# Patient Record
Sex: Male | Born: 1950 | Race: White | Hispanic: No | Smoking: Current every day smoker
Health system: Southern US, Community
[De-identification: ages and names within clinical notes are randomized; demographics above are authoritative.]

## PROBLEM LIST (undated history)

## (undated) DIAGNOSIS — N401 Enlarged prostate with lower urinary tract symptoms: Secondary | ICD-10-CM

## (undated) DIAGNOSIS — N400 Enlarged prostate without lower urinary tract symptoms: Secondary | ICD-10-CM

## (undated) DIAGNOSIS — N183 Chronic kidney disease, stage 3 unspecified: Secondary | ICD-10-CM

## (undated) DIAGNOSIS — N529 Male erectile dysfunction, unspecified: Secondary | ICD-10-CM

## (undated) DIAGNOSIS — N200 Calculus of kidney: Secondary | ICD-10-CM

## (undated) DIAGNOSIS — E785 Hyperlipidemia, unspecified: Secondary | ICD-10-CM

## (undated) HISTORY — DX: Hyperlipidemia, unspecified: E78.5

## (undated) HISTORY — PX: PROSTATE BIOPSY: SHX241

## (undated) HISTORY — DX: Male erectile dysfunction, unspecified: N52.9

## (undated) HISTORY — DX: Benign prostatic hyperplasia without lower urinary tract symptoms: N40.0

## (undated) HISTORY — DX: Calculus of kidney: N20.0

## (undated) HISTORY — DX: Benign prostatic hyperplasia with lower urinary tract symptoms: N40.1

## (undated) HISTORY — DX: Chronic kidney disease, stage 3 unspecified: N18.30

---
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
## 2015-05-27 DIAGNOSIS — N400 Enlarged prostate without lower urinary tract symptoms: Secondary | ICD-10-CM | POA: Diagnosis not present

## 2015-05-27 DIAGNOSIS — E785 Hyperlipidemia, unspecified: Secondary | ICD-10-CM | POA: Diagnosis not present

## 2015-05-27 DIAGNOSIS — N529 Male erectile dysfunction, unspecified: Secondary | ICD-10-CM | POA: Diagnosis not present

## 2015-05-27 DIAGNOSIS — Z23 Encounter for immunization: Secondary | ICD-10-CM | POA: Diagnosis not present

## 2015-11-17 DIAGNOSIS — Z23 Encounter for immunization: Secondary | ICD-10-CM | POA: Diagnosis not present

## 2015-11-17 DIAGNOSIS — Z1212 Encounter for screening for malignant neoplasm of rectum: Secondary | ICD-10-CM | POA: Diagnosis not present

## 2015-11-17 DIAGNOSIS — Z0000 Encounter for general adult medical examination without abnormal findings: Secondary | ICD-10-CM | POA: Diagnosis not present

## 2015-11-17 DIAGNOSIS — Z9181 History of falling: Secondary | ICD-10-CM | POA: Diagnosis not present

## 2015-11-17 DIAGNOSIS — E785 Hyperlipidemia, unspecified: Secondary | ICD-10-CM | POA: Diagnosis not present

## 2015-11-17 DIAGNOSIS — Z79899 Other long term (current) drug therapy: Secondary | ICD-10-CM | POA: Diagnosis not present

## 2015-11-17 DIAGNOSIS — E663 Overweight: Secondary | ICD-10-CM | POA: Diagnosis not present

## 2015-11-17 DIAGNOSIS — Z125 Encounter for screening for malignant neoplasm of prostate: Secondary | ICD-10-CM | POA: Diagnosis not present

## 2015-11-17 DIAGNOSIS — Z1211 Encounter for screening for malignant neoplasm of colon: Secondary | ICD-10-CM | POA: Diagnosis not present

## 2015-11-17 DIAGNOSIS — Z1389 Encounter for screening for other disorder: Secondary | ICD-10-CM | POA: Diagnosis not present

## 2015-11-25 DIAGNOSIS — Z720 Tobacco use: Secondary | ICD-10-CM | POA: Diagnosis not present

## 2015-11-25 DIAGNOSIS — R69 Illness, unspecified: Secondary | ICD-10-CM | POA: Diagnosis not present

## 2018-09-26 ENCOUNTER — Encounter: Payer: Self-pay | Admitting: Cardiology

## 2018-09-28 ENCOUNTER — Encounter: Payer: Self-pay | Admitting: Cardiology

## 2018-09-28 ENCOUNTER — Ambulatory Visit (INDEPENDENT_AMBULATORY_CARE_PROVIDER_SITE_OTHER): Payer: Medicare HMO | Admitting: Cardiology

## 2018-09-28 ENCOUNTER — Other Ambulatory Visit: Payer: Self-pay

## 2018-09-28 VITALS — BP 114/76 | HR 81 | Ht 74.0 in | Wt 210.8 lb

## 2018-09-28 DIAGNOSIS — I483 Typical atrial flutter: Secondary | ICD-10-CM

## 2018-09-28 DIAGNOSIS — I4892 Unspecified atrial flutter: Secondary | ICD-10-CM | POA: Diagnosis not present

## 2018-09-28 DIAGNOSIS — Z13220 Encounter for screening for lipoid disorders: Secondary | ICD-10-CM | POA: Diagnosis not present

## 2018-09-28 NOTE — Patient Instructions (Signed)
Medication Instructions:  Your physician has recommended you make the following change in your medication:   START: Aspirin 81 mg Take 1 tab daily   If you need a refill on your cardiac medications before your next appointment, please call your pharmacy.   Lab work: Your physician recommends that you return for lab work in: TODAY BMP,Magnesium,TSH  1 month : Fasting lipid  If you have labs (blood work) drawn today and your tests are completely normal, you will receive your results only by: Marland Kitchen MyChart Message (if you have MyChart) OR . A paper copy in the mail If you have any lab test that is abnormal or we need to change your treatment, we will call you to review the results.  Testing/Procedures: Your physician has requested that you have an echocardiogram. Echocardiography is a painless test that uses sound waves to create images of your heart. It provides your doctor with information about the size and shape of your heart and how well your heart's chambers and valves are working. This procedure takes approximately one hour. There are no restrictions for this procedure.  Your physician has recommended that you wear a ZIO monitor. ZIO monitors are medical devices that record the heart's electrical activity. Doctors most often use these monitors to diagnose arrhythmias. Arrhythmias are problems with the speed or rhythm of the heartbeat. The monitor is a small, portable device. You can wear one while you do your normal daily activities. This is usually used to diagnose what is causing palpitations/syncope (passing out).  Wear 3 days  Follow-Up: At [facility], you and your health needs are our priority.  As part of our continuing mission to provide you with exceptional heart care, we have created designated Provider Care Teams.  These Care Teams include your primary Cardiologist (physician) and Advanced Practice Providers (APPs -  Physician Assistants and Nurse Practitioners) who all work  together to provide you with the care you need, when you need it. You will need a follow up appointment in 2 months with Dr Harriet Masson. Any Other Special Instructions Will Be Listed Below (If Applicable).

## 2018-09-28 NOTE — Progress Notes (Addendum)
Cardiology Office Note:    Date:  09/28/2018   ID:  Dylan Gordon, DOB 05-Feb-1950, MRN [id number]  PCP:  Nicoletta Dress, MD  Cardiologist:  No primary care provider on file.  Electrophysiologist:  None   Referring MD: No ref. provider found   Referred for abnormal heart rhythm.  ASSESSMENT:    1. Typical Atrial Flutter   2. 3 4 5  Lipid screening  Hyperlipidemi Tobacco Use Chronic kidney disease stage III    PLAN:    1. The patient does have evidence of atrial flutter with variable AV block in the office today.  It appears to be atypical flutter.  For now we will put a 3-day monitor on patient to see if he is continuously in atrial flutter.  His chads Vascor today is 1, therefore at this time anticoagulation will not be started.  I will start him on aspirin 81 mg.  Treatment strategy was discussed with patient as if his ambulatory monitor show he is continuously in atrial flutter even though controlled it would be of benefit to give him of 3 weeks of anticoagulation, performed DC cardioversion and then continue anticoagulation for 4 weeks.  Probably that would help restore sinus rhythm and he would need to pursue an ablation.  He is also aware if we do need to pursue ablation he will have to see our EP colleagues. 2. Transthoracic echocardiogram has been ordered to assess LV dysfunction and structural abnormalities. 3. He will get lipid panel, BMP, TSH prior to his next visit.  4. Continue patient his statin. 5. Smoking Cessation advised. 6. The patient is in agreement with the above plan. The patient left the office in stable condition.  The patient will follow up in 8 weeks.   Medication Adjustments/Labs and Tests Ordered: Current medicines are reviewed at length with the patient today.  Concerns regarding medicines are outlined above.  Orders Placed This Encounter  Procedures  . Basic Metabolic Panel (BMET)  . Magnesium  . TSH  . Lipid Profile  . LONG TERM MONITOR  (3-14 DAYS)  . EKG 12-Lead  . ECHOCARDIOGRAM COMPLETE   No orders of the defined types were placed in this encounter.  History of Present Illness:    Dylan Gordon is a 68 y.o. male with a hx of hyperlipidemia, chronic kidney disease history, BPH who presents to be evaluated for abnormal heart rhythm.  Per patient he was getting his endoscopy when he was told to his heart rhythm was not right.  He states that he is never been told this before.  In addition he denies any palpitations, chest pain shortness of breath, lightheadedness or dizziness.  His records from the endoscopy center it was noted that the patient was in an irregular ventricular rhythm during his procedure.  Today he offers no complaints. Past Medical History:  Diagnosis Date  . Benign prostatic hyperplasia   . Calculus of kidney   . Chronic kidney disease, stage III (moderate) (HCC)   . Enlarged prostate with lower urinary tract symptoms (LUTS)   . Erectile dysfunction   . Hyperlipidemia     Past Surgical History:  Procedure Laterality Date  . PROSTATE BIOPSY    . TONSILLECTOMY  1954    Current Medications: Current Meds  Medication Sig  . albuterol (VENTOLIN HFA) 108 (90 Base) MCG/ACT inhaler Inhale 1-2 puffs into the lungs every 4 (four) hours as needed for wheezing or shortness of breath.  Marland Kitchen atorvastatin (LIPITOR) 20 MG tablet Take 20 mg  by mouth daily.  . Multiple Vitamin (MULTIVITAMIN WITH MINERALS) TABS tablet Take 1 tablet by mouth 2 (two) times a week.   . Omega-3 Fatty Acids (FISH OIL) 1000 MG CAPS Take 1,000 mg by mouth 2 (two) times a week.   . tamsulosin (FLOMAX) 0.4 MG CAPS capsule Take 0.4 mg by mouth daily after supper.     Allergies:   Patient has no known allergies.   Social History   Socioeconomic History  . Marital status: Divorced    Spouse name: Not on file  . Number of children: Not on file  . Years of education: Not on file  . Highest education level: Not on file  Occupational  History  . Not on file  Social Needs  . Financial resource strain: Not on file  . Food insecurity    Worry: Not on file    Inability: Not on file  . Transportation needs    Medical: Not on file    Non-medical: Not on file  Tobacco Use  . Smoking status: Current Every Day Smoker    Packs/day: 0.50    Years: 53.00    Pack years: 26.50    Types: Cigarettes  . Smokeless tobacco: Never Used  . Tobacco comment: 15 cigarettes per day  Substance and Sexual Activity  . Alcohol use: Not Currently  . Drug use: Never  . Sexual activity: Not on file  Lifestyle  . Physical activity    Days per week: Not on file    Minutes per session: Not on file  . Stress: Not on file  Relationships  . Social Musicianconnections    Talks on phone: Not on file    Gets together: Not on file    Attends religious service: Not on file    Active member of club or organization: Not on file    Attends meetings of clubs or organizations: Not on file    Relationship status: Not on file  Other Topics Concern  . Not on file  Social History Narrative  . Not on file     Family History: The patient's family history includes Arrhythmia in his mother; Asthma in his mother; Colon cancer in his maternal aunt; Emphysema in his father.  ROS:   Review of Systems  Constitution: Negative for decreased appetite, fever and weight gain.  HENT: Negative for congestion, ear discharge, hoarse voice and sore throat.   Eyes: Negative for discharge, redness, vision loss in right eye and visual halos.  Cardiovascular: Negative for chest pain, dyspnea on exertion, leg swelling, orthopnea and palpitations.  Respiratory: Negative for cough, hemoptysis, shortness of breath and snoring.   Endocrine: Negative for heat intolerance and polyphagia.  Hematologic/Lymphatic: Negative for bleeding problem. Does not bruise/bleed easily.  Skin: Negative for flushing, nail changes, rash and suspicious lesions.  Musculoskeletal: Negative for  arthritis, joint pain, muscle cramps, myalgias, neck pain and stiffness.  Gastrointestinal: Negative for abdominal pain, bowel incontinence, diarrhea and excessive appetite.  Genitourinary: Negative for decreased libido, genital sores and incomplete emptying.  Neurological: Negative for brief paralysis, focal weakness, headaches and loss of balance.  Psychiatric/Behavioral: Negative for altered mental status, depression and suicidal ideas.  Allergic/Immunologic: Negative for HIV exposure and persistent infections.    EKGs/Labs/Other Studies Reviewed:    The following studies were reviewed today:  EKG:   The ekg ordered today demonstrates EKG today shows evidence of atypical coarse atrial flutter, heart rate 81 bpm with variable AV block.  No previous EKG  Recent  Labs: No results found for requested labs within last 8760 hours.  Recent Lipid Panel No results found for: CHOL, TRIG, HDL, CHOLHDL, VLDL, LDLCALC, LDLDIRECT  Physical Exam:    VS:  BP 114/76 (BP Location: Right Arm, Patient Position: Sitting, Cuff Size: Normal)   Pulse 81   Ht 6\' 2"  (1.88 m)   Wt 210 lb 12.8 oz (95.6 kg)   SpO2 93%   BMI 27.07 kg/m     Wt Readings from Last 3 Encounters:  09/28/18 210 lb 12.8 oz (95.6 kg)     GEN: Well nourished, well developed in no acute distress HEENT: Mucous membranes moist, good dentition NECK: No JVD; No carotid bruits LYMPHATICS: No lymphadenopathy CARDIAC: S1S2 noted, RRR, no murmurs, rubs, gallops RESPIRATORY:  Clear to auscultation without rales, wheezing or rhonchi  ABDOMEN: Soft, non-tender, non-distended, bowel sounds noted, no guarding EXTREMITIES: Bilateral trace ankle, no cyanosis, no clubbing MUSCULOSKELETAL: No deformity  SKIN: Warm and dry NEUROLOGIC:  Alert and oriented x 3, nonfocal PSYCHIATRIC:  Normal affect, good insight   Patient Instructions  Medication Instructions:  Your physician has recommended you make the following change in your medication:    START: Aspirin 81 mg Take 1 tab daily   If you need a refill on your cardiac medications before your next appointment, please call your pharmacy.   Lab work: Your physician recommends that you return for lab work in: TODAY BMP,Magnesium,TSH  1 month : Fasting lipid  If you have labs (blood work) drawn today and your tests are completely normal, you will receive your results only by: Marland Kitchen MyChart Message (if you have MyChart) OR . A paper copy in the mail If you have any lab test that is abnormal or we need to change your treatment, we will call you to review the results.  Testing/Procedures: Your physician has requested that you have an echocardiogram. Echocardiography is a painless test that uses sound waves to create images of your heart. It provides your doctor with information about the size and shape of your heart and how well your heart's chambers and valves are working. This procedure takes approximately one hour. There are no restrictions for this procedure.  Your physician has recommended that you wear a ZIO monitor. ZIO monitors are medical devices that record the heart's electrical activity. Doctors most often use these monitors to diagnose arrhythmias. Arrhythmias are problems with the speed or rhythm of the heartbeat. The monitor is a small, portable device. You can wear one while you do your normal daily activities. This is usually used to diagnose what is causing palpitations/syncope (passing out).  Wear 3 days  Follow-Up: At [facility], you and your health needs are our priority.  As part of our continuing mission to provide you with exceptional heart care, we have created designated Provider Care Teams.  These Care Teams include your primary Cardiologist (physician) and Advanced Practice Providers (APPs -  Physician Assistants and Nurse Practitioners) who all work together to provide you with the care you need, when you need it. You will need a follow up appointment in 2  months with Dr Servando Salina. Any Other Special Instructions Will Be Listed Below (If Applicable).       Osvaldo Shipper, DO  09/28/2018 10:02 PM    [facility]

## 2018-09-29 LAB — BASIC METABOLIC PANEL
BUN/Creatinine Ratio: 12 (ref 10–24)
BUN: 13 mg/dL (ref 8–27)
CO2: 24 mmol/L (ref 20–29)
Calcium: 9.7 mg/dL (ref 8.6–10.2)
Chloride: 103 mmol/L (ref 96–106)
Creatinine, Ser: 1.09 mg/dL (ref 0.76–1.27)
GFR calc Af Amer: 80 mL/min/{1.73_m2} (ref >59)
GFR calc non Af Amer: 69 mL/min/{1.73_m2} (ref >59)
Glucose: 74 mg/dL (ref 65–99)
Potassium: 5.1 mmol/L (ref 3.5–5.2)
Sodium: 139 mmol/L (ref 134–144)

## 2018-09-29 LAB — TSH: TSH: 2.2 u[IU]/mL (ref 0.450–4.500)

## 2018-09-29 LAB — MAGNESIUM: Magnesium: 2.2 mg/dL (ref 1.6–2.3)

## 2018-10-01 ENCOUNTER — Telehealth: Payer: Self-pay | Admitting: Cardiology

## 2018-10-01 NOTE — Telephone Encounter (Signed)
Patient called and still has not received his monitor, please advise.

## 2018-10-01 NOTE — Telephone Encounter (Signed)
Telephone call to patient's both phones. No answer and no voicemail. Will continue efforts.

## 2018-10-01 NOTE — Telephone Encounter (Signed)
Telephone call to patient. Informed him it would take 2-4 business days and to call back if not received by the end of the week. Pt verbalized understanding.

## 2018-10-05 ENCOUNTER — Ambulatory Visit (INDEPENDENT_AMBULATORY_CARE_PROVIDER_SITE_OTHER): Payer: Medicare HMO

## 2018-10-05 DIAGNOSIS — I483 Typical atrial flutter: Secondary | ICD-10-CM

## 2018-10-29 DIAGNOSIS — I4892 Unspecified atrial flutter: Secondary | ICD-10-CM | POA: Insufficient documentation

## 2018-10-31 ENCOUNTER — Other Ambulatory Visit: Payer: Medicare HMO

## 2018-11-01 ENCOUNTER — Encounter: Payer: Self-pay | Admitting: Cardiology

## 2018-11-01 ENCOUNTER — Other Ambulatory Visit: Payer: Self-pay

## 2018-11-01 ENCOUNTER — Ambulatory Visit (INDEPENDENT_AMBULATORY_CARE_PROVIDER_SITE_OTHER): Payer: Medicare HMO

## 2018-11-01 ENCOUNTER — Ambulatory Visit: Payer: Medicare HMO | Admitting: Cardiology

## 2018-11-01 ENCOUNTER — Ambulatory Visit (INDEPENDENT_AMBULATORY_CARE_PROVIDER_SITE_OTHER): Payer: Medicare HMO | Admitting: Cardiology

## 2018-11-01 VITALS — BP 120/78 | HR 75 | Ht 74.0 in | Wt 215.0 lb

## 2018-11-01 DIAGNOSIS — Z01812 Encounter for preprocedural laboratory examination: Secondary | ICD-10-CM

## 2018-11-01 DIAGNOSIS — Z720 Tobacco use: Secondary | ICD-10-CM | POA: Diagnosis not present

## 2018-11-01 DIAGNOSIS — I4892 Unspecified atrial flutter: Secondary | ICD-10-CM | POA: Diagnosis not present

## 2018-11-01 DIAGNOSIS — E782 Mixed hyperlipidemia: Secondary | ICD-10-CM | POA: Diagnosis not present

## 2018-11-01 DIAGNOSIS — I483 Typical atrial flutter: Secondary | ICD-10-CM | POA: Diagnosis not present

## 2018-11-01 MED ORDER — APIXABAN 5 MG PO TABS: 5.0000 mg | ORAL_TABLET | Freq: Two times a day (BID) | ORAL | 3 refills | Status: DC

## 2018-11-01 MED ORDER — METOPROLOL SUCCINATE ER 25 MG PO TB24: 12.5000 mg | ORAL_TABLET | Freq: Every day | ORAL | 1 refills | Status: DC

## 2018-11-01 NOTE — Progress Notes (Signed)
Cardiology Office Note:    Date:  11/01/2018   ID:  Dylan Gordon, DOB 09/09/1950, MRN [id number]  PCP:  Dylan Fusi, MD  Cardiologist:  No primary care provider on file.  Electrophysiologist:  None   Referring MD: Dylan Fusi, MD   Chief Complaint  Patient presents with   Follow-up    monitor and echo    History of Present Illness:    Dylan Gordon is a 68 y.o. male with a hx of hyperlipidemia, chronic kidney disease, history of BPH.  I initially saw patient on September 28, 2018 at which time he presented to be evaluated for irregular heart rhythm.  In the office the patient was noted to be atrial flutter 4:1.  At that time we will start aspirin 81 mg daily.  The patient was asymptomatic and states that he did not feel any palpitations dizziness or lightheadedness.  ZIO monitoring patch were placed on patient to understand how much atrial flutter burden he had as well as his heart rate excursions.  Today he is here for follow-up visit after wearing his monitor.  As well as to discuss his echo that was done this morning.  He offers no complaints.  He denies chest pain, shortness of breath, lightheadedness, dizziness, nausea vomiting.  He again denies any symptoms during the duration of his monitoring.  Past Medical History:  Diagnosis Date   Benign prostatic hyperplasia    Calculus of kidney    Chronic kidney disease, stage III (moderate)    Enlarged prostate with lower urinary tract symptoms (LUTS)    Erectile dysfunction    Hyperlipidemia     Past Surgical History:  Procedure Laterality Date   PROSTATE BIOPSY     TONSILLECTOMY  1954    Current Medications: Current Meds  Medication Sig   albuterol (VENTOLIN HFA) 108 (90 Base) MCG/ACT inhaler Inhale 1-2 puffs into the lungs every 4 (four) hours as needed for wheezing or shortness of breath.   atorvastatin (LIPITOR) 20 MG tablet Take 20 mg by mouth daily.   finasteride (PROSCAR) 5 MG tablet  Take 5 mg by mouth daily.   Multiple Vitamin (MULTIVITAMIN WITH MINERALS) TABS tablet Take 1 tablet by mouth 2 (two) times a week.    Omega-3 Fatty Acids (FISH OIL) 1000 MG CAPS Take 1,000 mg by mouth 2 (two) times a week.    tamsulosin (FLOMAX) 0.4 MG CAPS capsule Take 0.4 mg by mouth daily after supper.   [DISCONTINUED] aspirin EC 81 MG tablet Take 81 mg by mouth daily.     Allergies:   Patient has no known allergies.   Social History   Socioeconomic History   Marital status: Divorced    Spouse name: Not on file   Number of children: Not on file   Years of education: Not on file   Highest education level: Not on file  Occupational History   Not on file  Social Needs   Financial resource strain: Not on file   Food insecurity    Worry: Not on file    Inability: Not on file   Transportation needs    Medical: Not on file    Non-medical: Not on file  Tobacco Use   Smoking status: Current Every Day Smoker    Packs/day: 0.50    Years: 53.00    Pack years: 26.50    Types: Cigarettes   Smokeless tobacco: Never Used   Tobacco comment: 15 cigarettes per day  Substance and Sexual Activity  Alcohol use: Not Currently   Drug use: Never   Sexual activity: Not on file  Lifestyle   Physical activity    Days per week: Not on file    Minutes per session: Not on file   Stress: Not on file  Relationships   Social connections    Talks on phone: Not on file    Gets together: Not on file    Attends religious service: Not on file    Active member of club or organization: Not on file    Attends meetings of clubs or organizations: Not on file    Relationship status: Not on file  Other Topics Concern   Not on file  Social History Narrative   Not on file     Family History: The patient's family history includes Arrhythmia in his mother; Asthma in his mother; Colon cancer in his maternal aunt; Emphysema in his father.  ROS:   Review of Systems  Constitution:  Negative for decreased appetite, fever and weight gain.  HENT: Negative for congestion, ear discharge, hoarse voice and sore throat.   Eyes: Negative for discharge, redness, vision loss in right eye and visual halos.  Cardiovascular: Negative for chest pain, dyspnea on exertion, leg swelling, orthopnea and palpitations.  Respiratory: Negative for cough, hemoptysis, shortness of breath and snoring.   Endocrine: Negative for heat intolerance and polyphagia.  Hematologic/Lymphatic: Negative for bleeding problem. Does not bruise/bleed easily.  Skin: Negative for flushing, nail changes, rash and suspicious lesions.  Musculoskeletal: Negative for arthritis, joint pain, muscle cramps, myalgias, neck pain and stiffness.  Gastrointestinal: Negative for abdominal pain, bowel incontinence, diarrhea and excessive appetite.  Genitourinary: Negative for decreased libido, genital sores and incomplete emptying.  Neurological: Negative for brief paralysis, focal weakness, headaches and loss of balance.  Psychiatric/Behavioral: Negative for altered mental status, depression and suicidal ideas.  Allergic/Immunologic: Negative for HIV exposure and persistent infections.    EKGs/Labs/Other Studies Reviewed:    The following studies were reviewed today:   EKG:  The ekg ordered today demonstrates typical atrial flutter heart rate 71 bpm.  Ambulatory monitoring: From September 25 to October 08, 2018. The patient wore the monitor for 3 days.  Indication: Atrial flutter. Atrial Flutter occurred continuously (100% burden), ranging from 45-144 bpm (avg of 76 bpm).  Premature ventricular complexes were rare (<1.0%).  No pause, No ventricular tachycardia.  No patient triggered event or diary noted.   Conclusion: Remarkable study for 100% burden of atrial flutter.  Recent Labs: 09/28/2018: BUN 13; Creatinine, Ser 1.09; Magnesium 2.2; Potassium 5.1; Sodium 139; TSH 2.200  Recent Lipid Panel No results found  for: CHOL, TRIG, HDL, CHOLHDL, VLDL, LDLCALC, LDLDIRECT  Physical Exam:    VS:  BP 120/78 (BP Location: Right Arm, Patient Position: Sitting, Cuff Size: Normal)    Pulse 75    Ht  (1.88 m)    Wt 215 lb (97.5 kg)    SpO2 96%    BMI 27.60 kg/m     Wt Readings from Last 3 Encounters:  11/01/18 215 lb (97.5 kg)  09/28/18 210 lb 12.8 oz (95.6 kg)     GEN: Well nourished, well developed in no acute distress HEENT: Normal NECK: No JVD; No carotid bruits LYMPHATICS: No lymphadenopathy CARDIAC: S1S2 noted,RRR, no murmurs, rubs, gallops RESPIRATORY:  Clear to auscultation without rales, wheezing or rhonchi  ABDOMEN: Soft, non-tender, non-distended, +bowel sounds, no guarding. EXTREMITIES: No edema, No cyanosis, no clubbing MUSCULOSKELETAL:  No edema; No deformity  SKIN:  Warm and dry NEUROLOGIC:  Alert and oriented x 3, non-focal PSYCHIATRIC:  Normal affect, good insight  ASSESSMENT:    1. Atrial flutter, unspecified type (HCC)   2. Pre-procedure lab exam   3. Mixed hyperlipidemia   4. Tobacco use    PLAN:     1.  The patient is here for follow-up visit today I was able to discuss results of his ambulatory monitor with him.  He was in atrial flutter 100% of the time.  He still does not feel this.  Advised patient to stop the aspirin 81 mg daily.  Therefore at this time will be appropriate to go ahead and anticoagulate the patient for 3 weeks and plan cardioversion thereafter.  I discussed this with the patient he is in agreement with proceeding with the anticoagulation.  I have educated patient on the side effects of this medication. I also urge he abstain from any taking behaviors.  He understands that he is now at a high risk of bleeding due to being on anticoagulation.  He was also advised that if he ever falls and especially hit his head to be seen in the emergency department.  2.  In addition given prescriptions on his monitor I am going to be starting patient on metoprolol  succinate 12.5 mg daily.  He was educated on this medication and also advised to call the office if he experience any dizziness or lightheadedness.  3.  The patient had his echocardiogram performed today preliminary reviewed by me show LVEF of 60 to 65%.  Trivial mitral regurgitation.  Complete results results will be dictated later.  4.  The patient was counseled on tobacco cessation today for 5 minutes.  Counseling included reviewing the risks of smoking tobacco products, how it impacts the patient's current medical diagnoses and different strategies for quitting.  Pharmacotherapy to aid in tobacco cessation was not prescribed today. The patient coordinate with  primary care provider.  The patient was also advised to call   1-800-QUIT-NOW (3052760908) for additional help with quitting smoking.  The patient is in agreement with the above plan. The patient left the office in stable condition.  The patient will follow up in 4 weeks   Medication Adjustments/Labs and Tests Ordered: Current medicines are reviewed at length with the patient today.  Concerns regarding medicines are outlined above.  Orders Placed This Encounter  Procedures   Basic Metabolic Panel (BMET)   CBC   EKG 12-Lead   Meds ordered this encounter  Medications   metoprolol succinate (TOPROL XL) 25 MG 24 hr tablet    Sig: Take 0.5 tablets (12.5 mg total) by mouth daily.    Dispense:  45 tablet    Refill:  1   apixaban (ELIQUIS) 5 MG TABS tablet    Sig: Take 1 tablet (5 mg total) by mouth 2 (two) times daily.    Dispense:  60 tablet    Refill:  3    Patient Instructions  Medication Instructions:  Your physician has recommended you make the following change in your medication:   STOP : Aspirin  START: TOPROL XL(metoprolol succinate ) 25 mg Take 1/ 2 tab (12.5 mg ) daily START: ELIQUIS 5 mg Take 1 tab twice daily  *If you need a refill on your cardiac medications before your next appointment, please call your  pharmacy*  Lab Work: Your physician recommends that you return for lab work in: TODAY BMP,CBC  If you have labs (blood work) drawn today and your  tests are completely normal, you will receive your results only by:  MyChart Message (if you have MyChart) OR  A paper copy in the mail If you have any lab test that is abnormal or we need to change your treatment, we will call you to review the results.  Testing/Procedures: Your physician has recommended that you have a Cardioversion (DCCV). Electrical Cardioversion uses a jolt of electricity to your heart either through paddles or wired patches attached to your chest. This is a controlled, usually prescheduled, procedure. Defibrillation is done under light anesthesia in the hospital, and you usually go home the day of the procedure. This is done to get your heart back into a normal rhythm. You are not awake for the procedure. Please see the instruction sheet given to you today.  We will schedule this at [facility] in 4 weeks and call you with the date and time and instructions.  Follow-Up: At [facility], you and your health needs are our priority.  As part of our continuing mission to provide you with exceptional heart care, we have created designated Provider Care Teams.  These Care Teams include your primary Cardiologist (physician) and Advanced Practice Providers (APPs -  Physician Assistants and Nurse Practitioners) who all work together to provide you with the care you need, when you need it.  Your next appointment:   4 weeks  The format for your next appointment:   In Person  Provider:   Berniece Salines, DO  Other Instructions  Apixaban oral tablets What is this medicine? APIXABAN (a PIX a ban) is an anticoagulant (blood thinner). It is used to lower the chance of stroke in people with a medical condition called atrial fibrillation. It is also used to treat or prevent blood clots in the lungs or in the veins. This medicine  may be used for other purposes; ask your health care provider or pharmacist if you have questions. COMMON BRAND NAME(S): Eliquis What should I tell my health care provider before I take this medicine? They need to know if you have any of these conditions:  antiphospholipid antibody syndrome  bleeding disorders  bleeding in the brain  blood in your stools (black or tarry stools) or if you have blood in your vomit  history of blood clots  history of stomach bleeding  kidney disease  liver disease  mechanical heart valve  an unusual or allergic reaction to apixaban, other medicines, foods, dyes, or preservatives  pregnant or trying to get pregnant  breast-feeding How should I use this medicine? Take this medicine by mouth with a glass of water. Follow the directions on the prescription label. You can take it with or without food. If it upsets your stomach, take it with food. Take your medicine at regular intervals. Do not take it more often than directed. Do not stop taking except on your doctor's advice. Stopping this medicine may increase your risk of a blood clot. Be sure to refill your prescription before you run out of medicine. Talk to your pediatrician regarding the use of this medicine in children. Special care may be needed. Overdosage: If you think you have taken too much of this medicine contact a poison control center or emergency room at once. NOTE: This medicine is only for you. Do not share this medicine with others. What if I miss a dose? If you miss a dose, take it as soon as you can. If it is almost time for your next dose, take only that dose.  Do not take double or extra doses. What may interact with this medicine? This medicine may interact with the following:  aspirin and aspirin-like medicines  certain medicines for fungal infections like ketoconazole and itraconazole  certain medicines for seizures like carbamazepine and phenytoin  certain medicines  that treat or prevent blood clots like warfarin, enoxaparin, and dalteparin  clarithromycin  NSAIDs, medicines for pain and inflammation, like ibuprofen or naproxen  rifampin  ritonavir  St. John's wort This list may not describe all possible interactions. Give your health care provider a list of all the medicines, herbs, non-prescription drugs, or dietary supplements you use. Also tell them if you smoke, drink alcohol, or use illegal drugs. Some items may interact with your medicine. What should I watch for while using this medicine? Visit your healthcare professional for regular checks on your progress. You may need blood work done while you are taking this medicine. Your condition will be monitored carefully while you are receiving this medicine. It is important not to miss any appointments. Avoid sports and activities that might cause injury while you are using this medicine. Severe falls or injuries can cause unseen bleeding. Be careful when using sharp tools or knives. Consider using an Neurosurgeonelectric razor. Take special care brushing or flossing your teeth. Report any injuries, bruising, or red spots on the skin to your healthcare professional. If you are going to need surgery or other procedure, tell your healthcare professional that you are taking this medicine. Wear a medical ID bracelet or chain. Carry a card that describes your disease and details of your medicine and dosage times. What side effects may I notice from receiving this medicine? Side effects that you should report to your doctor or health care professional as soon as possible:  allergic reactions like skin rash, itching or hives, swelling of the face, lips, or tongue  signs and symptoms of bleeding such as bloody or black, tarry stools; red or dark-brown urine; spitting up blood or brown material that looks like coffee grounds; red spots on the skin; unusual bruising or bleeding from the eye, gums, or nose  signs and  symptoms of a blood clot such as chest pain; shortness of breath; pain, swelling, or warmth in the leg  signs and symptoms of a stroke such as changes in vision; confusion; trouble speaking or understanding; severe headaches; sudden numbness or weakness of the face, arm or leg; trouble walking; dizziness; loss of coordination This list may not describe all possible side effects. Call your doctor for medical advice about side effects. You may report side effects to FDA at 1-800-FDA-1088. Where should I keep my medicine? Keep out of the reach of children. Store at room temperature between 20 and 25 degrees C (68 and 77 degrees F). Throw away any unused medicine after the expiration date. NOTE: This sheet is a summary. It may not cover all possible information. If you have questions about this medicine, talk to your doctor, pharmacist, or health care provider.  2020 Elsevier/Gold Standard (2017-09-06 17:39:34) Metoprolol extended-release tablets What is this medicine? METOPROLOL (me TOE proe lole) is a beta-blocker. Beta-blockers reduce the workload on the heart and help it to beat more regularly. This medicine is used to treat high blood pressure and to prevent chest pain. It is also used to after a heart attack and to prevent an additional heart attack from occurring. This medicine may be used for other purposes; ask your health care provider or pharmacist if you have questions. COMMON  BRAND NAME(S): toprol, Toprol XL What should I tell my health care provider before I take this medicine? They need to know if you have any of these conditions:  diabetes  heart or vessel disease like slow heart rate, worsening heart failure, heart block, sick sinus syndrome or Raynaud's disease  kidney disease  liver disease  lung or breathing disease, like asthma or emphysema  pheochromocytoma  thyroid disease  an unusual or allergic reaction to metoprolol, other beta-blockers, medicines, foods, dyes,  or preservatives  pregnant or trying to get pregnant  breast-feeding How should I use this medicine? Take this medicine by mouth with a glass of water. Follow the directions on the prescription label. Do not crush or chew. Take this medicine with or immediately after meals. Take your doses at regular intervals. Do not take more medicine than directed. Do not stop taking this medicine suddenly. This could lead to serious heart-related effects. Talk to your pediatrician regarding the use of this medicine in children. While this drug may be prescribed for children as young as 6 years for selected conditions, precautions do apply. Overdosage: If you think you have taken too much of this medicine contact a poison control center or emergency room at once. NOTE: This medicine is only for you. Do not share this medicine with others. What if I miss a dose? If you miss a dose, take it as soon as you can. If it is almost time for your next dose, take only that dose. Do not take double or extra doses. What may interact with this medicine? This medicine may interact with the following medications:  certain medicines for blood pressure, heart disease, irregular heart beat  certain medicines for depression, like monoamine oxidase (MAO) inhibitors, fluoxetine, or paroxetine  clonidine  dobutamine  epinephrine  isoproterenol  reserpine This list may not describe all possible interactions. Give your health care provider a list of all the medicines, herbs, non-prescription drugs, or dietary supplements you use. Also tell them if you smoke, drink alcohol, or use illegal drugs. Some items may interact with your medicine. What should I watch for while using this medicine? Visit your doctor or health care professional for regular check ups. Contact your doctor right away if your symptoms worsen. Check your blood pressure and pulse rate regularly. Ask your health care professional what your blood pressure and  pulse rate should be, and when you should contact them. You may get drowsy or dizzy. Do not drive, use machinery, or do anything that needs mental alertness until you know how this medicine affects you. Do not sit or stand up quickly, especially if you are an older patient. This reduces the risk of dizzy or fainting spells. Contact your doctor if these symptoms continue. Alcohol may interfere with the effect of this medicine. Avoid alcoholic drinks. This medicine may increase blood sugar. Ask your healthcare provider if changes in diet or medicines are needed if you have diabetes. What side effects may I notice from receiving this medicine? Side effects that you should report to your doctor or health care professional as soon as possible:  allergic reactions like skin rash, itching or hives  cold or numb hands or feet  depression  difficulty breathing  faint  fever with sore throat  irregular heartbeat, chest pain  rapid weight gain   signs and symptoms of high blood sugar such as being more thirsty or hungry or having to urinate more than normal. You may also feel very tired or  have blurry vision.  swollen legs or ankles Side effects that usually do not require medical attention (report to your doctor or health care professional if they continue or are bothersome):  anxiety or nervousness  change in sex drive or performance  dry skin  headache  nightmares or trouble sleeping  short term memory loss  stomach upset or diarrhea This list may not describe all possible side effects. Call your doctor for medical advice about side effects. You may report side effects to FDA at 1-800-FDA-1088. Where should I keep my medicine? Keep out of the reach of children. Store at room temperature between 15 and 30 degrees C (59 and 86 degrees F). Throw away any unused medicine after the expiration date. NOTE: This sheet is a summary. It may not cover all possible information. If you have  questions about this medicine, talk to your doctor, pharmacist, or health care provider.  2020 Elsevier/Gold Standard (2017-10-17 11:09:41)      Adopting a Healthy Lifestyle.  Know what a healthy weight is for you (roughly BMI <25) and aim to maintain this   Aim for 7+ servings of fruits and vegetables daily   65-80+ fluid ounces of water or unsweet tea for healthy kidneys   Limit to max 1 drink of alcohol per day; avoid smoking/tobacco   Limit animal fats in diet for cholesterol and heart health - choose grass fed whenever available   Avoid highly processed foods, and foods high in saturated/trans fats   Aim for low stress - take time to unwind and care for your mental health   Aim for 150 min of moderate intensity exercise weekly for heart health, and weights twice weekly for bone health   Aim for 7-9 hours of sleep daily   When it comes to diets, agreement about the perfect plan isnt easy to find, even among the experts. Experts at the [facility] [employer] developed an idea known as the Healthy Eating Plate. Just imagine a plate divided into logical, healthy portions.   The emphasis is on diet quality:   Load up on vegetables and fruits - one-half of your plate: Aim for color and variety, and remember that potatoes dont count.   Go for whole grains - one-quarter of your plate: Whole wheat, barley, wheat berries, quinoa, oats, brown rice, and foods made with them. If you want pasta, go with whole wheat pasta.   Protein power - one-quarter of your plate: Fish, chicken, beans, and nuts are all healthy, versatile protein sources. Limit red meat.   The diet, however, does go beyond the plate, offering a few other suggestions.   Use healthy plant oils, such as olive, canola, soy, corn, sunflower and peanut. Check the labels, and avoid partially hydrogenated oil, which have unhealthy trans fats.   If youre thirsty, drink water. Coffee and tea are good in  moderation, but skip sugary drinks and limit milk and dairy products to one or two daily servings.   The type of carbohydrate in the diet is more important than the amount. Some sources of carbohydrates, such as vegetables, fruits, whole grains, and beans-are healthier than others.   Finally, stay active  Signed, Thomasene Ripple, DO  11/01/2018 10:38 AM    [facility]

## 2018-11-01 NOTE — Progress Notes (Signed)
Complete echocardiogram has been performed.  Jimmy Herold Salguero RDCS, RVT 

## 2018-11-01 NOTE — Patient Instructions (Signed)
Medication Instructions:  Your physician has recommended you make the following change in your medication:   STOP : Aspirin  START: TOPROL XL(metoprolol succinate ) 25 mg Take 1/ 2 tab (12.5 mg ) daily START: ELIQUIS 5 mg Take 1 tab twice daily  *If you need a refill on your cardiac medications before your next appointment, please call your pharmacy*  Lab Work: Your physician recommends that you return for lab work in: TODAY BMP,CBC  If you have labs (blood work) drawn today and your tests are completely normal, you will receive your results only by: Marland Kitchen MyChart Message (if you have MyChart) OR . A paper copy in the mail If you have any lab test that is abnormal or we need to change your treatment, we will call you to review the results.  Testing/Procedures: Your physician has recommended that you have a Cardioversion (DCCV). Electrical Cardioversion uses a jolt of electricity to your heart either through paddles or wired patches attached to your chest. This is a controlled, usually prescheduled, procedure. Defibrillation is done under light anesthesia in the hospital, and you usually go home the day of the procedure. This is done to get your heart back into a normal rhythm. You are not awake for the procedure. Please see the instruction sheet given to you today.  We will schedule this at [facility] in 4 weeks and call you with the date and time and instructions.  Follow-Up: At [facility], you and your health needs are our priority.  As part of our continuing mission to provide you with exceptional heart care, we have created designated Provider Care Teams.  These Care Teams include your primary Cardiologist (physician) and Advanced Practice Providers (APPs -  Physician Assistants and Nurse Practitioners) who all work together to provide you with the care you need, when you need it.  Your next appointment:   4 weeks  The format for your next appointment:   In  Person  Provider:   Thomasene Ripple, DO  Other Instructions  Apixaban oral tablets What is this medicine? APIXABAN (a PIX a ban) is an anticoagulant (blood thinner). It is used to lower the chance of stroke in people with a medical condition called atrial fibrillation. It is also used to treat or prevent blood clots in the lungs or in the veins. This medicine may be used for other purposes; ask your health care provider or pharmacist if you have questions. COMMON BRAND NAME(S): Eliquis What should I tell my health care provider before I take this medicine? They need to know if you have any of these conditions:  antiphospholipid antibody syndrome  bleeding disorders  bleeding in the brain  blood in your stools (black or tarry stools) or if you have blood in your vomit  history of blood clots  history of stomach bleeding  kidney disease  liver disease  mechanical heart valve  an unusual or allergic reaction to apixaban, other medicines, foods, dyes, or preservatives  pregnant or trying to get pregnant  breast-feeding How should I use this medicine? Take this medicine by mouth with a glass of water. Follow the directions on the prescription label. You can take it with or without food. If it upsets your stomach, take it with food. Take your medicine at regular intervals. Do not take it more often than directed. Do not stop taking except on your doctor's advice. Stopping this medicine may increase your risk of a blood clot. Be sure to refill your prescription before  you run out of medicine. Talk to your pediatrician regarding the use of this medicine in children. Special care may be needed. Overdosage: If you think you have taken too much of this medicine contact a poison control center or emergency room at once. NOTE: This medicine is only for you. Do not share this medicine with others. What if I miss a dose? If you miss a dose, take it as soon as you can. If it is almost time  for your next dose, take only that dose. Do not take double or extra doses. What may interact with this medicine? This medicine may interact with the following:  aspirin and aspirin-like medicines  certain medicines for fungal infections like ketoconazole and itraconazole  certain medicines for seizures like carbamazepine and phenytoin  certain medicines that treat or prevent blood clots like warfarin, enoxaparin, and dalteparin  clarithromycin  NSAIDs, medicines for pain and inflammation, like ibuprofen or naproxen  rifampin  ritonavir  St. John's wort This list may not describe all possible interactions. Give your health care provider a list of all the medicines, herbs, non-prescription drugs, or dietary supplements you use. Also tell them if you smoke, drink alcohol, or use illegal drugs. Some items may interact with your medicine. What should I watch for while using this medicine? Visit your healthcare professional for regular checks on your progress. You may need blood work done while you are taking this medicine. Your condition will be monitored carefully while you are receiving this medicine. It is important not to miss any appointments. Avoid sports and activities that might cause injury while you are using this medicine. Severe falls or injuries can cause unseen bleeding. Be careful when using sharp tools or knives. Consider using an Neurosurgeon. Take special care brushing or flossing your teeth. Report any injuries, bruising, or red spots on the skin to your healthcare professional. If you are going to need surgery or other procedure, tell your healthcare professional that you are taking this medicine. Wear a medical ID bracelet or chain. Carry a card that describes your disease and details of your medicine and dosage times. What side effects may I notice from receiving this medicine? Side effects that you should report to your doctor or health care professional as soon as  possible:  allergic reactions like skin rash, itching or hives, swelling of the face, lips, or tongue  signs and symptoms of bleeding such as bloody or black, tarry stools; red or dark-brown urine; spitting up blood or brown material that looks like coffee grounds; red spots on the skin; unusual bruising or bleeding from the eye, gums, or nose  signs and symptoms of a blood clot such as chest pain; shortness of breath; pain, swelling, or warmth in the leg  signs and symptoms of a stroke such as changes in vision; confusion; trouble speaking or understanding; severe headaches; sudden numbness or weakness of the face, arm or leg; trouble walking; dizziness; loss of coordination This list may not describe all possible side effects. Call your doctor for medical advice about side effects. You may report side effects to FDA at 1-800-FDA-1088. Where should I keep my medicine? Keep out of the reach of children. Store at room temperature between 20 and 25 degrees C (68 and 77 degrees F). Throw away any unused medicine after the expiration date. NOTE: This sheet is a summary. It may not cover all possible information. If you have questions about this medicine, talk to your doctor, pharmacist, or health  care provider.  2020 Elsevier/Gold Standard (2017-09-06 17:39:34) Metoprolol extended-release tablets What is this medicine? METOPROLOL (me TOE proe lole) is a beta-blocker. Beta-blockers reduce the workload on the heart and help it to beat more regularly. This medicine is used to treat high blood pressure and to prevent chest pain. It is also used to after a heart attack and to prevent an additional heart attack from occurring. This medicine may be used for other purposes; ask your health care provider or pharmacist if you have questions. COMMON BRAND NAME(S): toprol, Toprol XL What should I tell my health care provider before I take this medicine? They need to know if you have any of these  conditions:  diabetes  heart or vessel disease like slow heart rate, worsening heart failure, heart block, sick sinus syndrome or Raynaud's disease  kidney disease  liver disease  lung or breathing disease, like asthma or emphysema  pheochromocytoma  thyroid disease  an unusual or allergic reaction to metoprolol, other beta-blockers, medicines, foods, dyes, or preservatives  pregnant or trying to get pregnant  breast-feeding How should I use this medicine? Take this medicine by mouth with a glass of water. Follow the directions on the prescription label. Do not crush or chew. Take this medicine with or immediately after meals. Take your doses at regular intervals. Do not take more medicine than directed. Do not stop taking this medicine suddenly. This could lead to serious heart-related effects. Talk to your pediatrician regarding the use of this medicine in children. While this drug may be prescribed for children as young as 6 years for selected conditions, precautions do apply. Overdosage: If you think you have taken too much of this medicine contact a poison control center or emergency room at once. NOTE: This medicine is only for you. Do not share this medicine with others. What if I miss a dose? If you miss a dose, take it as soon as you can. If it is almost time for your next dose, take only that dose. Do not take double or extra doses. What may interact with this medicine? This medicine may interact with the following medications:  certain medicines for blood pressure, heart disease, irregular heart beat  certain medicines for depression, like monoamine oxidase (MAO) inhibitors, fluoxetine, or paroxetine  clonidine  dobutamine  epinephrine  isoproterenol  reserpine This list may not describe all possible interactions. Give your health care provider a list of all the medicines, herbs, non-prescription drugs, or dietary supplements you use. Also tell them if you  smoke, drink alcohol, or use illegal drugs. Some items may interact with your medicine. What should I watch for while using this medicine? Visit your doctor or health care professional for regular check ups. Contact your doctor right away if your symptoms worsen. Check your blood pressure and pulse rate regularly. Ask your health care professional what your blood pressure and pulse rate should be, and when you should contact them. You may get drowsy or dizzy. Do not drive, use machinery, or do anything that needs mental alertness until you know how this medicine affects you. Do not sit or stand up quickly, especially if you are an older patient. This reduces the risk of dizzy or fainting spells. Contact your doctor if these symptoms continue. Alcohol may interfere with the effect of this medicine. Avoid alcoholic drinks. This medicine may increase blood sugar. Ask your healthcare provider if changes in diet or medicines are needed if you have diabetes. What side effects may I  notice from receiving this medicine? Side effects that you should report to your doctor or health care professional as soon as possible:  allergic reactions like skin rash, itching or hives  cold or numb hands or feet  depression  difficulty breathing  faint  fever with sore throat  irregular heartbeat, chest pain  rapid weight gain   signs and symptoms of high blood sugar such as being more thirsty or hungry or having to urinate more than normal. You may also feel very tired or have blurry vision.  swollen legs or ankles Side effects that usually do not require medical attention (report to your doctor or health care professional if they continue or are bothersome):  anxiety or nervousness  change in sex drive or performance  dry skin  headache  nightmares or trouble sleeping  short term memory loss  stomach upset or diarrhea This list may not describe all possible side effects. Call your doctor for  medical advice about side effects. You may report side effects to FDA at 1-800-FDA-1088. Where should I keep my medicine? Keep out of the reach of children. Store at room temperature between 15 and 30 degrees C (59 and 86 degrees F). Throw away any unused medicine after the expiration date. NOTE: This sheet is a summary. It may not cover all possible information. If you have questions about this medicine, talk to your doctor, pharmacist, or health care provider.  2020 Elsevier/Gold Standard (2017-10-17 11:09:41)

## 2018-11-02 LAB — CBC
Hematocrit: 44.9 % (ref 37.5–51.0)
Hemoglobin: 14.5 g/dL (ref 13.0–17.7)
MCH: 29.8 pg (ref 26.6–33.0)
MCHC: 32.3 g/dL (ref 31.5–35.7)
MCV: 92 fL (ref 79–97)
Platelets: 222 10*3/uL (ref 150–450)
RBC: 4.86 x10E6/uL (ref 4.14–5.80)
RDW: 12.8 % (ref 11.6–15.4)
WBC: 9.6 10*3/uL (ref 3.4–10.8)

## 2018-11-02 LAB — BASIC METABOLIC PANEL
BUN/Creatinine Ratio: 12 (ref 10–24)
BUN: 15 mg/dL (ref 8–27)
CO2: 24 mmol/L (ref 20–29)
Calcium: 9.6 mg/dL (ref 8.6–10.2)
Chloride: 104 mmol/L (ref 96–106)
Creatinine, Ser: 1.25 mg/dL (ref 0.76–1.27)
GFR calc Af Amer: 67 mL/min/{1.73_m2} (ref >59)
GFR calc non Af Amer: 58 mL/min/{1.73_m2} — ABNORMAL LOW (ref >59)
Glucose: 91 mg/dL (ref 65–99)
Potassium: 4.9 mmol/L (ref 3.5–5.2)
Sodium: 141 mmol/L (ref 134–144)

## 2018-11-19 DIAGNOSIS — I4892 Unspecified atrial flutter: Secondary | ICD-10-CM | POA: Diagnosis not present

## 2018-11-28 ENCOUNTER — Ambulatory Visit: Payer: Medicare HMO | Admitting: Cardiology

## 2018-11-29 ENCOUNTER — Encounter: Payer: Self-pay | Admitting: Cardiology

## 2018-11-29 ENCOUNTER — Ambulatory Visit (INDEPENDENT_AMBULATORY_CARE_PROVIDER_SITE_OTHER): Payer: Medicare HMO | Admitting: Cardiology

## 2018-11-29 ENCOUNTER — Other Ambulatory Visit: Payer: Self-pay

## 2018-11-29 VITALS — BP 120/60 | HR 60 | Ht 74.0 in | Wt 219.0 lb

## 2018-11-29 DIAGNOSIS — I4892 Unspecified atrial flutter: Secondary | ICD-10-CM

## 2018-11-29 MED ORDER — APIXABAN 5 MG PO TABS: 5.0000 mg | ORAL_TABLET | Freq: Two times a day (BID) | ORAL | 5 refills | Status: DC

## 2018-11-29 NOTE — Patient Instructions (Signed)
Medication Instructions:  Your physician recommends that you continue on your current medications as directed. Please refer to the Current Medication list given to you today.  *If you need a refill on your cardiac medications before your next appointment, please call your pharmacy*  Lab Work: None If you have labs (blood work) drawn today and your tests are completely normal, you will receive your results only by: . MyChart Message (if you have MyChart) OR . A paper copy in the mail If you have any lab test that is abnormal or we need to change your treatment, we will call you to review the results.  Testing/Procedures: NOne  Follow-Up: At [facility], you and your health needs are our priority.  As part of our continuing mission to provide you with exceptional heart care, we have created designated Provider Care Teams.  These Care Teams include your primary Cardiologist (physician) and Advanced Practice Providers (APPs -  Physician Assistants and Nurse Practitioners) who all work together to provide you with the care you need, when you need it.  Your next appointment:   3 month(s)  The format for your next appointment:   In Person  Provider:   Kardie Tobb, DO  Other Instructions   

## 2018-11-29 NOTE — Progress Notes (Signed)
Cardiology Office Note:    Date:  11/29/2018   ID:  Dylan Gordon, DOB Nov 17, 1950, MRN [id number]  PCP:  Paulina FusiSchultz, Douglas E, MD  Cardiologist:  Thomasene RippleKardie Everet Flagg, DO  Electrophysiologist:  None   Referring MD: Paulina FusiSchultz, Douglas E, MD   Chief Complaint  Patient presents with  . Follow-up    History of Present Illness:    Dylan Gordon is a 68 y.o. male with a hx of hyperlipidemia, chronic kidney disease, history of BPH.  I initially saw patient on September 28, 2018 at which time he presented to be evaluated for irregular heart rhythm.  In the office the patient was noted to be atrial flutter 4:1.  At that time we will start aspirin 81 mg daily.  The patient was asymptomatic and states that he did not feel any palpitations dizziness or lightheadedness.  ZIO monitoring patch were placed on patient to understand how much atrial flutter burden he had as well as his heart rate excursions.  I saw the patient November 01, 2018 at that time I started him on Eliquis 5 mg twice daily.  Discussed DC cardioversion with the patient.  During the time he was educated about the procedure he was willing to undergo the procedure.  In addition he had an echocardiogram which I reviewed with him.  In the interim, he was able to undergo DC cardioversion successfully converted to this rhythm.   Past Medical History:  Diagnosis Date  . Benign prostatic hyperplasia   . Calculus of kidney   . Chronic kidney disease, stage III (moderate)   . Enlarged prostate with lower urinary tract symptoms (LUTS)   . Erectile dysfunction   . Hyperlipidemia     Past Surgical History:  Procedure Laterality Date  . PROSTATE BIOPSY    . TONSILLECTOMY  1954    Current Medications: Current Meds  Medication Sig  . albuterol (VENTOLIN HFA) 108 (90 Base) MCG/ACT inhaler Inhale 1-2 puffs into the lungs every 4 (four) hours as needed for wheezing or shortness of breath.  Marland Kitchen. apixaban (ELIQUIS) 5 MG TABS tablet Take 1 tablet (5 mg  total) by mouth 2 (two) times daily.  Marland Kitchen. atorvastatin (LIPITOR) 20 MG tablet Take 20 mg by mouth daily.  . finasteride (PROSCAR) 5 MG tablet Take 5 mg by mouth daily.  . metoprolol succinate (TOPROL XL) 25 MG 24 hr tablet Take 0.5 tablets (12.5 mg total) by mouth daily.  . Multiple Vitamin (MULTIVITAMIN WITH MINERALS) TABS tablet Take 1 tablet by mouth 2 (two) times a week.   . Omega-3 Fatty Acids (FISH OIL) 1000 MG CAPS Take 1,000 mg by mouth 2 (two) times a week.   . tamsulosin (FLOMAX) 0.4 MG CAPS capsule Take 0.4 mg by mouth daily after supper.  . [DISCONTINUED] apixaban (ELIQUIS) 5 MG TABS tablet Take 1 tablet (5 mg total) by mouth 2 (two) times daily.     Allergies:   Patient has no known allergies.   Social History   Socioeconomic History  . Marital status: Divorced    Spouse name: Not on file  . Number of children: Not on file  . Years of education: Not on file  . Highest education level: Not on file  Occupational History  . Not on file  Social Needs  . Financial resource strain: Not on file  . Food insecurity    Worry: Not on file    Inability: Not on file  . Transportation needs    Medical: Not on file  Non-medical: Not on file  Tobacco Use  . Smoking status: Current Every Day Smoker    Packs/day: 0.50    Years: 53.00    Pack years: 26.50    Types: Cigarettes  . Smokeless tobacco: Never Used  . Tobacco comment: 15 cigarettes per day  Substance and Sexual Activity  . Alcohol use: Not Currently  . Drug use: Never  . Sexual activity: Not on file  Lifestyle  . Physical activity    Days per week: Not on file    Minutes per session: Not on file  . Stress: Not on file  Relationships  . Social Musician on phone: Not on file    Gets together: Not on file    Attends religious service: Not on file    Active member of club or organization: Not on file    Attends meetings of clubs or organizations: Not on file    Relationship status: Not on file  Other  Topics Concern  . Not on file  Social History Narrative  . Not on file     Family History: The patient's family history includes Arrhythmia in his mother; Asthma in his mother; Colon cancer in his maternal aunt; Emphysema in his father.  ROS:   Review of Systems  Constitution: Negative for decreased appetite, fever and weight gain.  HENT: Negative for congestion, ear discharge, hoarse voice and sore throat.   Eyes: Negative for discharge, redness, vision loss in right eye and visual halos.  Cardiovascular: Negative for chest pain, dyspnea on exertion, leg swelling, orthopnea and palpitations.  Respiratory: Negative for cough, hemoptysis, shortness of breath and snoring.   Endocrine: Negative for heat intolerance and polyphagia.  Hematologic/Lymphatic: Negative for bleeding problem. Does not bruise/bleed easily.  Skin: Negative for flushing, nail changes, rash and suspicious lesions.  Musculoskeletal: Negative for arthritis, joint pain, muscle cramps, myalgias, neck pain and stiffness.  Gastrointestinal: Negative for abdominal pain, bowel incontinence, diarrhea and excessive appetite.  Genitourinary: Negative for decreased libido, genital sores and incomplete emptying.  Neurological: Negative for brief paralysis, focal weakness, headaches and loss of balance.  Psychiatric/Behavioral: Negative for altered mental status, depression and suicidal ideas.  Allergic/Immunologic: Negative for HIV exposure and persistent infections.    EKGs/Labs/Other Studies Reviewed:    The following studies were reviewed today:   EKG:  The ekg ordered today demonstrates sinus bradycardia heart rate 59 bpm with arrhythmia and first-degree AV block.  Compared to prior EKG atrial flutter no longer exist.  Transthoracic echocardiogram 11/01/2018 MPRESSIONS  1. Left ventricular ejection fraction, by visual estimation, is 60 to 65%. The left ventricle has normal function. Normal left ventricular size. There is  no left ventricular hypertrophy.  2. Left ventricular diastolic Doppler parameters are indeterminate pattern of LV diastolic filling.  3. Global right ventricle has normal systolic function.The right ventricular size is normal. No increase in right ventricular wall thickness.  4. Left atrial size was normal.  5. Right atrial size was normal.  6. The mitral valve is normal in structure. Trace mitral valve regurgitation. No evidence of mitral stenosis.  7. The tricuspid valve is normal in structure. Tricuspid valve regurgitation is trivial.  8. The aortic valve is normal in structure. Aortic valve regurgitation was not visualized by color flow Doppler. Structurally normal aortic valve, with no evidence of sclerosis or stenosis.  9. The pulmonic valve was normal in structure. Pulmonic valve regurgitation is not visualized by color flow Doppler. 10. Normal pulmonary artery systolic  pressure.  Recent Labs: 09/28/2018: Magnesium 2.2; TSH 2.200 11/01/2018: BUN 15; Creatinine, Ser 1.25; Hemoglobin 14.5; Platelets 222; Potassium 4.9; Sodium 141  Recent Lipid Panel No results found for: CHOL, TRIG, HDL, CHOLHDL, VLDL, LDLCALC, LDLDIRECT  Physical Exam:    VS:  BP 120/60 (BP Location: Right Arm, Patient Position: Sitting, Cuff Size: Normal)   Pulse 60   Ht 6\' 2"  (1.88 m)   Wt 219 lb (99.3 kg)   SpO2 95%   BMI 28.12 kg/m     Wt Readings from Last 3 Encounters:  11/29/18 219 lb (99.3 kg)  11/01/18 215 lb (97.5 kg)  09/28/18 210 lb 12.8 oz (95.6 kg)     GEN: Well nourished, well developed in no acute distress HEENT: Normal NECK: No JVD; No carotid bruits LYMPHATICS: No lymphadenopathy CARDIAC: S1S2 noted,RRR, no murmurs, rubs, gallops RESPIRATORY:  Clear to auscultation without rales, wheezing or rhonchi  ABDOMEN: Soft, non-tender, non-distended, +bowel sounds, no guarding. EXTREMITIES: No edema, No cyanosis, no clubbing MUSCULOSKELETAL:  No edema; No deformity  SKIN: Warm and dry  NEUROLOGIC:  Alert and oriented x 3, non-focal PSYCHIATRIC:  Normal affect, good insight  ASSESSMENT:    1. Atrial flutter, unspecified type (Colon)    PLAN:    1.  He is status post DC cardioversion which was successful, he is in sinus rhythm today.  I discussed with the patient he still needs to be anticoagulated.  In addition we will continue him on a rate control agent.  Ideally he should be on antiarrhythmics to keep him in sinus rhythm.  But after discussing with the patient about the side effects of these medications and how it works he prefers to hold off on this for now.  Therefore we will continue to monitor him closely at every visit with her EKG as the patient does not have symptoms when he is in atrial tachycardia.  2.  We also provided patient with samples today as cost is an issue for his Eliquis but prefers to stay on this medication.  We will also give him information for assistance for his medication.  The patient is in agreement with the above plan. The patient left the office in stable condition.  The patient will follow up in 3 months   Medication Adjustments/Labs and Tests Ordered: Current medicines are reviewed at length with the patient today.  Concerns regarding medicines are outlined above.  Orders Placed This Encounter  Procedures  . EKG 12-Lead   Meds ordered this encounter  Medications  . apixaban (ELIQUIS) 5 MG TABS tablet    Sig: Take 1 tablet (5 mg total) by mouth 2 (two) times daily.    Dispense:  60 tablet    Refill:  5    Patient Instructions  Medication Instructions:  Your physician recommends that you continue on your current medications as directed. Please refer to the Current Medication list given to you today.  *If you need a refill on your cardiac medications before your next appointment, please call your pharmacy*  Lab Work: None If you have labs (blood work) drawn today and your tests are completely normal, you will receive your results  only by: Marland Kitchen MyChart Message (if you have MyChart) OR . A paper copy in the mail If you have any lab test that is abnormal or we need to change your treatment, we will call you to review the results.  Testing/Procedures: NOne  Follow-Up: At [facility], you and your health needs are our priority.  As part of our continuing mission to provide you with exceptional heart care, we have created designated Provider Care Teams.  These Care Teams include your primary Cardiologist (physician) and Advanced Practice Providers (APPs -  Physician Assistants and Nurse Practitioners) who all work together to provide you with the care you need, when you need it.  Your next appointment:   3 month(s)  The format for your next appointment:   In Person  Provider:   Thomasene Ripple, DO  Other Instructions      Adopting a Healthy Lifestyle.  Know what a healthy weight is for you (roughly BMI <25) and aim to maintain this   Aim for 7+ servings of fruits and vegetables daily   65-80+ fluid ounces of water or unsweet tea for healthy kidneys   Limit to max 1 drink of alcohol per day; avoid smoking/tobacco   Limit animal fats in diet for cholesterol and heart health - choose grass fed whenever available   Avoid highly processed foods, and foods high in saturated/trans fats   Aim for low stress - take time to unwind and care for your mental health   Aim for 150 min of moderate intensity exercise weekly for heart health, and weights twice weekly for bone health   Aim for 7-9 hours of sleep daily   When it comes to diets, agreement about the perfect plan isnt easy to find, even among the experts. Experts at the [facility] [employer] developed an idea known as the Healthy Eating Plate. Just imagine a plate divided into logical, healthy portions.   The emphasis is on diet quality:   Load up on vegetables and fruits - one-half of your plate: Aim for color and variety, and remember that  potatoes dont count.   Go for whole grains - one-quarter of your plate: Whole wheat, barley, wheat berries, quinoa, oats, brown rice, and foods made with them. If you want pasta, go with whole wheat pasta.   Protein power - one-quarter of your plate: Fish, chicken, beans, and nuts are all healthy, versatile protein sources. Limit red meat.   The diet, however, does go beyond the plate, offering a few other suggestions.   Use healthy plant oils, such as olive, canola, soy, corn, sunflower and peanut. Check the labels, and avoid partially hydrogenated oil, which have unhealthy trans fats.   If youre thirsty, drink water. Coffee and tea are good in moderation, but skip sugary drinks and limit milk and dairy products to one or two daily servings.   The type of carbohydrate in the diet is more important than the amount. Some sources of carbohydrates, such as vegetables, fruits, whole grains, and beans-are healthier than others.   Finally, stay active  Signed, Thomasene Ripple, DO  11/29/2018 12:09 PM    [facility]

## 2019-03-01 ENCOUNTER — Other Ambulatory Visit: Payer: Self-pay

## 2019-03-01 ENCOUNTER — Ambulatory Visit (INDEPENDENT_AMBULATORY_CARE_PROVIDER_SITE_OTHER): Payer: Medicare HMO | Admitting: Cardiology

## 2019-03-01 ENCOUNTER — Encounter: Payer: Self-pay | Admitting: Cardiology

## 2019-03-01 VITALS — BP 110/70 | HR 78 | Ht 74.0 in | Wt 222.0 lb

## 2019-03-01 DIAGNOSIS — I10 Essential (primary) hypertension: Secondary | ICD-10-CM | POA: Diagnosis not present

## 2019-03-01 DIAGNOSIS — I4892 Unspecified atrial flutter: Secondary | ICD-10-CM

## 2019-03-01 DIAGNOSIS — E782 Mixed hyperlipidemia: Secondary | ICD-10-CM

## 2019-03-01 NOTE — Patient Instructions (Signed)
Medication Instructions:  Your physician recommends that you continue on your current medications as directed. Please refer to the Current Medication list given to you today.  *If you need a refill on your cardiac medications before your next appointment, please call your pharmacy*  Lab Work: None If you have labs (blood work) drawn today and your tests are completely normal, you will receive your results only by: . MyChart Message (if you have MyChart) OR . A paper copy in the mail If you have any lab test that is abnormal or we need to change your treatment, we will call you to review the results.  Testing/Procedures: NOne  Follow-Up: At [facility], you and your health needs are our priority.  As part of our continuing mission to provide you with exceptional heart care, we have created designated Provider Care Teams.  These Care Teams include your primary Cardiologist (physician) and Advanced Practice Providers (APPs -  Physician Assistants and Nurse Practitioners) who all work together to provide you with the care you need, when you need it.  Your next appointment:   6 month(s)  The format for your next appointment:   In Person  Provider:   Kardie Tobb, DO  Other Instructions   

## 2019-03-01 NOTE — Progress Notes (Signed)
Cardiology Office Note:    Date:  03/02/2019   ID:  Dylan Gordon, DOB 1950/10/07, MRN [id number]  PCP:  Paulina Fusi, MD  Cardiologist:  Thomasene Ripple, DO  Electrophysiologist:  None   Referring MD: Paulina Fusi, MD   Chief Complaint  Patient presents with  . Follow-up    History of Present Illness:    Dylan Atkinsis a 69 y.o.malewith a hx of hyperlipidemia, chronic kidney disease, history of BPH. I initially saw patient on September 28, 2018 at which time he presented to be evaluated for irregularheart rhythm. In the office the patient was noted to be atrial flutter 4:1.At that time we will start aspirin 81 mg daily. The patient was asymptomatic and states that he did not feel any palpitations dizziness or lightheadedness. ZIO monitoring patch were placed on patient to understand how much atrial flutter burden he had as well as his heart rate excursions.  I saw the patient November 01, 2018 at that time I started him on Eliquis 5 mg twice daily.  Discussed DC cardioversion with the patient.  During the time he was educated about the procedure he was willing to undergo the procedure.  In addition he had an echocardiogram which I reviewed with him.   He was seen on November 29, 2018 at that time was status post cardioversion and was back in sinus rhythm.  Discussed with patient starting him on antiarrhythmics but he prefers to stay on rate control and did not any other new medication.  Given our visit today he appears to be doing well from a cardiovascular standpoint.  He denies any symptoms.  Past Medical History:  Diagnosis Date  . Benign prostatic hyperplasia   . Calculus of kidney   . Chronic kidney disease, stage III (moderate)   . Enlarged prostate with lower urinary tract symptoms (LUTS)   . Erectile dysfunction   . Hyperlipidemia     Past Surgical History:  Procedure Laterality Date  . PROSTATE BIOPSY    . TONSILLECTOMY  1954    Current  Medications: Current Meds  Medication Sig  . apixaban (ELIQUIS) 5 MG TABS tablet Take 1 tablet (5 mg total) by mouth 2 (two) times daily.  Marland Kitchen atorvastatin (LIPITOR) 20 MG tablet Take 20 mg by mouth daily.  . finasteride (PROSCAR) 5 MG tablet Take 5 mg by mouth daily.  . metoprolol succinate (TOPROL XL) 25 MG 24 hr tablet Take 0.5 tablets (12.5 mg total) by mouth daily.  . Multiple Vitamin (MULTIVITAMIN WITH MINERALS) TABS tablet Take 1 tablet by mouth 2 (two) times a week.   . Omega-3 Fatty Acids (FISH OIL) 1000 MG CAPS Take 1,000 mg by mouth 2 (two) times a week.   . tamsulosin (FLOMAX) 0.4 MG CAPS capsule Take 0.4 mg by mouth daily after supper.     Allergies:   Patient has no known allergies.   Social History   Socioeconomic History  . Marital status: Divorced    Spouse name: Not on file  . Number of children: Not on file  . Years of education: Not on file  . Highest education level: Not on file  Occupational History  . Not on file  Tobacco Use  . Smoking status: Current Every Day Smoker    Packs/day: 0.50    Years: 53.00    Pack years: 26.50    Types: Cigarettes  . Smokeless tobacco: Never Used  . Tobacco comment: 15 cigarettes per day  Substance and Sexual Activity  .  Alcohol use: Not Currently  . Drug use: Never  . Sexual activity: Not on file  Other Topics Concern  . Not on file  Social History Narrative  . Not on file   Social Determinants of Health   Financial Resource Strain:   . Difficulty of Paying Living Expenses: Not on file  Food Insecurity:   . Worried About Programme researcher, broadcasting/film/video in the Last Year: Not on file  . Ran Out of Food in the Last Year: Not on file  Transportation Needs:   . Lack of Transportation (Medical): Not on file  . Lack of Transportation (Non-Medical): Not on file  Physical Activity:   . Days of Exercise per Week: Not on file  . Minutes of Exercise per Session: Not on file  Stress:   . Feeling of Stress : Not on file  Social  Connections:   . Frequency of Communication with Friends and Family: Not on file  . Frequency of Social Gatherings with Friends and Family: Not on file  . Attends Religious Services: Not on file  . Active Member of Clubs or Organizations: Not on file  . Attends Banker Meetings: Not on file  . Marital Status: Not on file     Family History: The patient's family history includes Arrhythmia in his mother; Asthma in his mother; Colon cancer in his maternal aunt; Emphysema in his father.  ROS:   Review of Systems  Constitution: Negative for decreased appetite, fever and weight gain.  HENT: Negative for congestion, ear discharge, hoarse voice and sore throat.   Eyes: Negative for discharge, redness, vision loss in right eye and visual halos.  Cardiovascular: Negative for chest pain, dyspnea on exertion, leg swelling, orthopnea and palpitations.  Respiratory: Negative for cough, hemoptysis, shortness of breath and snoring.   Endocrine: Negative for heat intolerance and polyphagia.  Hematologic/Lymphatic: Negative for bleeding problem. Does not bruise/bleed easily.  Skin: Negative for flushing, nail changes, rash and suspicious lesions.  Musculoskeletal: Negative for arthritis, joint pain, muscle cramps, myalgias, neck pain and stiffness.  Gastrointestinal: Negative for abdominal pain, bowel incontinence, diarrhea and excessive appetite.  Genitourinary: Negative for decreased libido, genital sores and incomplete emptying.  Neurological: Negative for brief paralysis, focal weakness, headaches and loss of balance.  Psychiatric/Behavioral: Negative for altered mental status, depression and suicidal ideas.  Allergic/Immunologic: Negative for HIV exposure and persistent infections.    EKGs/Labs/Other Studies Reviewed:    The following studies were reviewed today:   EKG: None performed today.  Recent Labs: 09/28/2018: Magnesium 2.2; TSH 2.200 11/01/2018: BUN 15; Creatinine, Ser  1.25; Hemoglobin 14.5; Platelets 222; Potassium 4.9; Sodium 141  Recent Lipid Panel No results found for: CHOL, TRIG, HDL, CHOLHDL, VLDL, LDLCALC, LDLDIRECT  Physical Exam:    VS:  BP 110/70 (BP Location: Left Arm, Patient Position: Sitting, Cuff Size: Normal)   Pulse 78   Ht 6\' 2"  (1.88 m)   Wt 222 lb (100.7 kg)   SpO2 99%   BMI 28.50 kg/m     Wt Readings from Last 3 Encounters:  03/01/19 222 lb (100.7 kg)  11/29/18 219 lb (99.3 kg)  11/01/18 215 lb (97.5 kg)     GEN: Well nourished, well developed in no acute distress HEENT: Normal NECK: No JVD; No carotid bruits LYMPHATICS: No lymphadenopathy CARDIAC: S1S2 noted,RRR, no murmurs, rubs, gallops RESPIRATORY:  Clear to auscultation without rales, wheezing or rhonchi  ABDOMEN: Soft, non-tender, non-distended, +bowel sounds, no guarding. EXTREMITIES: No edema, No cyanosis, no  clubbing MUSCULOSKELETAL:  No deformity  SKIN: Warm and dry NEUROLOGIC:  Alert and oriented x 3, non-focal PSYCHIATRIC:  Normal affect, good insight  ASSESSMENT:    1. Atrial flutter, unspecified type ([place])   2. Essential hypertension    PLAN:     1.  Continue patient his metoprolol.  He still preferred to be off antiarrhythmics.  Continue eliquis for anticoagulation . We will give sample of  Eliquis today .  2. Blood pressure is acceptable - continue with current regimen.  3. Continue lipitor 20 mg daily for hyperlipidemia.  The patient is in agreement with the above plan. The patient left the office in stable condition.  The patient will follow up in 6 months.   Medication Adjustments/Labs and Tests Ordered: Current medicines are reviewed at length with the patient today.  Concerns regarding medicines are outlined above.  No orders of the defined types were placed in this encounter.  No orders of the defined types were placed in this encounter.   Patient Instructions  Medication Instructions:  Your physician recommends that you continue  on your current medications as directed. Please refer to the Current Medication list given to you today.  *If you need a refill on your cardiac medications before your next appointment, please call your pharmacy*  Lab Work: None  If you have labs (blood work) drawn today and your tests are completely normal, you will receive your results only by: Marland Kitchen MyChart Message (if you have MyChart) OR . A paper copy in the mail If you have any lab test that is abnormal or we need to change your treatment, we will call you to review the results.  Testing/Procedures: NOne  Follow-Up: At [facility], you and your health needs are our priority.  As part of our continuing mission to provide you with exceptional heart care, we have created designated Provider Care Teams.  These Care Teams include your primary Cardiologist (physician) and Advanced Practice Providers (APPs -  Physician Assistants and Nurse Practitioners) who all work together to provide you with the care you need, when you need it.  Your next appointment:   6 month(s)  The format for your next appointment:   In Person  Provider:   Berniece Salines, DO  Other Instructions       Adopting a Healthy Lifestyle.  Know what a healthy weight is for you (roughly BMI <25) and aim to maintain this   Aim for 7+ servings of fruits and vegetables daily   65-80+ fluid ounces of water or unsweet tea for healthy kidneys   Limit to max 1 drink of alcohol per day; avoid smoking/tobacco   Limit animal fats in diet for cholesterol and heart health - choose grass fed whenever available   Avoid highly processed foods, and foods high in saturated/trans fats   Aim for low stress - take time to unwind and care for your mental health   Aim for 150 min of moderate intensity exercise weekly for heart health, and weights twice weekly for bone health   Aim for 7-9 hours of sleep daily   When it comes to diets, agreement about the perfect plan isnt  easy to find, even among the experts. Experts at the [employer] developed an idea known as the Healthy Eating Plate. Just imagine a plate divided into logical, healthy portions.   The emphasis is on diet quality:   Load up on vegetables and fruits - one-half of your plate: Aim  for color and variety, and remember that potatoes dont count.   Go for whole grains - one-quarter of your plate: Whole wheat, barley, wheat berries, quinoa, oats, brown rice, and foods made with them. If you want pasta, go with whole wheat pasta.   Protein power - one-quarter of your plate: Fish, chicken, beans, and nuts are all healthy, versatile protein sources. Limit red meat.   The diet, however, does go beyond the plate, offering a few other suggestions.   Use healthy plant oils, such as olive, canola, soy, corn, sunflower and peanut. Check the labels, and avoid partially hydrogenated oil, which have unhealthy trans fats.   If youre thirsty, drink water. Coffee and tea are good in moderation, but skip sugary drinks and limit milk and dairy products to one or two daily servings.   The type of carbohydrate in the diet is more important than the amount. Some sources of carbohydrates, such as vegetables, fruits, whole grains, and beans-are healthier than others.   Finally, stay active  Signed, Thomasene Ripple, DO  03/02/2019 12:09 PM    [facility]

## 2019-03-02 DIAGNOSIS — I10 Essential (primary) hypertension: Secondary | ICD-10-CM | POA: Insufficient documentation

## 2019-03-02 DIAGNOSIS — E782 Mixed hyperlipidemia: Secondary | ICD-10-CM | POA: Insufficient documentation

## 2019-04-19 ENCOUNTER — Other Ambulatory Visit: Payer: Self-pay | Admitting: Cardiology

## 2019-08-29 ENCOUNTER — Other Ambulatory Visit: Payer: Self-pay

## 2019-08-29 ENCOUNTER — Encounter: Payer: Self-pay | Admitting: Cardiology

## 2019-08-29 ENCOUNTER — Ambulatory Visit: Payer: Medicare HMO | Admitting: Cardiology

## 2019-08-29 VITALS — BP 130/80 | HR 80 | Ht 74.0 in | Wt 225.0 lb

## 2019-08-29 DIAGNOSIS — I10 Essential (primary) hypertension: Secondary | ICD-10-CM | POA: Diagnosis not present

## 2019-08-29 DIAGNOSIS — E782 Mixed hyperlipidemia: Secondary | ICD-10-CM | POA: Diagnosis not present

## 2019-08-29 DIAGNOSIS — I483 Typical atrial flutter: Secondary | ICD-10-CM | POA: Diagnosis not present

## 2019-08-29 NOTE — Progress Notes (Signed)
Cardiology Office Note:    Date:  08/29/2019   ID:  Dylan Gordon, DOB August 20, 1950, MRN [id number]  PCP:  Paulina Fusi, MD  Cardiologist:  Thomasene Ripple, DO  Electrophysiologist:  None   Referring MD: Paulina Fusi, MD   Chief Complaint  Patient presents with   Follow-up    History of Present Illness:    Dylan Gordon a 69 y.o.malewith a hx of hyperlipidemia, chronic kidney disease, history of BPH. I initially saw patient on September 28, 2018 at which time he presented to be evaluated for irregularheart rhythm. In the office the patient was noted to be atrial flutter 4:1.ZIO monitoring patch were placed on patient to understand how much atrial flutter burden he had as well as his heart rate excursions.  I saw the patient November 01, 2018 at that time I started him on Eliquis 5 mg twice daily. Discussed DC cardioversion with the patient. During the time he was educated about the procedure he was willing to undergo the procedure. In addition he had an echocardiogram which I reviewed with him.   He was seen on November 29, 2018 at that time was status post cardioversion and was back in sinus rhythm.  Discussed with patient starting him on antiarrhythmics but he prefers to stay on rate control and did not want any other new medication.  He was seen on March 01, 2019 at that time he still preferred to be off anticoagulation he was in sinus rhythm.  We therefore continued his beta-blocker as well as his Eliquis and also give the patient some samples.  He is here today for follow-up visit he offers no complaints at this time.  Past Medical History:  Diagnosis Date   Benign prostatic hyperplasia    Calculus of kidney    Chronic kidney disease, stage III (moderate)    Enlarged prostate with lower urinary tract symptoms (LUTS)    Erectile dysfunction    Hyperlipidemia     Past Surgical History:  Procedure Laterality Date   PROSTATE BIOPSY      TONSILLECTOMY  1954    Current Medications: Current Meds  Medication Sig   apixaban (ELIQUIS) 5 MG TABS tablet Take 1 tablet (5 mg total) by mouth 2 (two) times daily.   atorvastatin (LIPITOR) 20 MG tablet Take 20 mg by mouth daily.   finasteride (PROSCAR) 5 MG tablet Take 5 mg by mouth daily.   metoprolol succinate (TOPROL-XL) 25 MG 24 hr tablet TAKE 1/2 TABLET BY MOUTH EVERY DAY   Multiple Vitamin (MULTIVITAMIN WITH MINERALS) TABS tablet Take 1 tablet by mouth 2 (two) times a week.    Omega-3 Fatty Acids (FISH OIL) 1000 MG CAPS Take 1,000 mg by mouth 2 (two) times a week.    tamsulosin (FLOMAX) 0.4 MG CAPS capsule Take 0.4 mg by mouth daily after supper.     Allergies:   Patient has no known allergies.   Social History   Socioeconomic History   Marital status: Divorced    Spouse name: Not on file   Number of children: Not on file   Years of education: Not on file   Highest education level: Not on file  Occupational History   Not on file  Tobacco Use   Smoking status: Current Every Day Smoker    Packs/day: 0.50    Years: 53.00    Pack years: 26.50    Types: Cigarettes   Smokeless tobacco: Never Used   Tobacco comment: 15 cigarettes per day  Vaping Use   Vaping Use: Never used  Substance and Sexual Activity   Alcohol use: Not Currently   Drug use: Never   Sexual activity: Not on file  Other Topics Concern   Not on file  Social History Narrative   Not on file   Social Determinants of Health   Financial Resource Strain:    Difficulty of Paying Living Expenses: Not on file  Food Insecurity:    Worried About Running Out of Food in the Last Year: Not on file   Ran Out of Food in the Last Year: Not on file  Transportation Needs:    Lack of Transportation (Medical): Not on file   Lack of Transportation (Non-Medical): Not on file  Physical Activity:    Days of Exercise per Week: Not on file   Minutes of Exercise per Session: Not on file    Stress:    Feeling of Stress : Not on file  Social Connections:    Frequency of Communication with Friends and Family: Not on file   Frequency of Social Gatherings with Friends and Family: Not on file   Attends Religious Services: Not on file   Active Member of Clubs or Organizations: Not on file   Attends Banker Meetings: Not on file   Marital Status: Not on file     Family History: The patient's family history includes Arrhythmia in his mother; Asthma in his mother; Colon cancer in his maternal aunt; Emphysema in his father.  ROS:   Review of Systems  Constitution: Negative for decreased appetite, fever and weight gain.  HENT: Negative for congestion, ear discharge, hoarse voice and sore throat.   Eyes: Negative for discharge, redness, vision loss in right eye and visual halos.  Cardiovascular: Negative for chest pain, dyspnea on exertion, leg swelling, orthopnea and palpitations.  Respiratory: Negative for cough, hemoptysis, shortness of breath and snoring.   Endocrine: Negative for heat intolerance and polyphagia.  Hematologic/Lymphatic: Negative for bleeding problem. Does not bruise/bleed easily.  Skin: Negative for flushing, nail changes, rash and suspicious lesions.  Musculoskeletal: Negative for arthritis, joint pain, muscle cramps, myalgias, neck pain and stiffness.  Gastrointestinal: Negative for abdominal pain, bowel incontinence, diarrhea and excessive appetite.  Genitourinary: Negative for decreased libido, genital sores and incomplete emptying.  Neurological: Negative for brief paralysis, focal weakness, headaches and loss of balance.  Psychiatric/Behavioral: Negative for altered mental status, depression and suicidal ideas.  Allergic/Immunologic: Negative for HIV exposure and persistent infections.    EKGs/Labs/Other Studies Reviewed:    The following studies were reviewed today:   EKG:  The ekg ordered today demonstrates atrial flutter with  4:1 AV block HR 90 bpm with nonspecific ST changes.  Echo IMPRESSIONS  1. Left ventricular ejection fraction, by visual estimation, is 60 to 65%. The left ventricle has normal function. Normal left ventricular size.  There is no left ventricular hypertrophy.  2. Left ventricular diastolic Doppler parameters are indeterminate pattern of LV diastolic filling.  3. Global right ventricle has normal systolic function.The right ventricular size is normal. No increase in right ventricular wall thickness.  4. Left atrial size was normal.  5. Right atrial size was normal.  6. The mitral valve is normal in structure. Trace mitral valve regurgitation. No evidence of mitral stenosis.  7. The tricuspid valve is normal in structure. Tricuspid valve regurgitation is trivial.  8. The aortic valve is normal in structure. Aortic valve regurgitation was not visualized by color flow Doppler. Structurally normal aortic  valve, with no evidence of sclerosis or stenosis.  9. The pulmonic valve was normal in structure. Pulmonic valve regurgitation is not visualized by color flow Doppler.  10. Normal pulmonary artery systolic pressure.    Recent Labs: 09/28/2018: Magnesium 2.2; TSH 2.200 11/01/2018: BUN 15; Creatinine, Ser 1.25; Hemoglobin 14.5; Platelets 222; Potassium 4.9; Sodium 141  Recent Lipid Panel No results found for: CHOL, TRIG, HDL, CHOLHDL, VLDL, LDLCALC, LDLDIRECT  Physical Exam:    VS:  BP 130/80 (BP Location: Right Arm, Patient Position: Sitting, Cuff Size: Normal)    Pulse 80    Ht 6\' 2"  (1.88 m)    Wt 225 lb (102.1 kg)    SpO2 95%    BMI 28.89 kg/m     Wt Readings from Last 3 Encounters:  08/29/19 225 lb (102.1 kg)  03/01/19 222 lb (100.7 kg)  11/29/18 219 lb (99.3 kg)     GEN: Well nourished, well developed in no acute distress HEENT: Normal NECK: No JVD; No carotid bruits LYMPHATICS: No lymphadenopathy CARDIAC: S1S2 noted,RRR, no murmurs, rubs, gallops RESPIRATORY:  Clear to  auscultation without rales, wheezing or rhonchi  ABDOMEN: Soft, non-tender, non-distended, +bowel sounds, no guarding. EXTREMITIES: No edema, No cyanosis, no clubbing MUSCULOSKELETAL:  No deformity  SKIN: Warm and dry NEUROLOGIC:  Alert and oriented x 3, non-focal PSYCHIATRIC:  Normal affect, good insight  ASSESSMENT:    1. Typical atrial flutter (HCC)   2. Essential hypertension   3. Mixed hyperlipidemia    PLAN:     1.  He is in typical flutter today.  He does not have any symptoms.  Unfortunately we again discussed possibly an antiarrhythmic and he preferred not to have additional medication.  I am going to refer the patient to EP as he may benefit from flutter ablation.  For now continue metoprolol daily 12.5 mg daily and his Eliquis 5 mg daily.  We have given the patient samples of Eliquis today as well.  2.  blood pressure acceptable in the office no changes will be made.  3.  Continue patient atorvastatin.  The patient is in agreement with the above plan. The patient left the office in stable condition.  The patient will follow up in   Medication Adjustments/Labs and Tests Ordered: Current medicines are reviewed at length with the patient today.  Concerns regarding medicines are outlined above.  Orders Placed This Encounter  Procedures   Ambulatory referral to Cardiac Electrophysiology   EKG 12-Lead   No orders of the defined types were placed in this encounter.   Patient Instructions  Medication Instructions:  None ordered  *If you need a refill on your cardiac medications before your next appointment, please call your pharmacy*   Lab Work: None ordered If you have labs (blood work) drawn today and your tests are completely normal, you will receive your results only by:  MyChart Message (if you have MyChart) OR  A paper copy in the mail If you have any lab test that is abnormal or we need to change your treatment, we will call you to review the  results.   Testing/Procedures: None ordered   Follow-Up: At [facility], you and your health needs are our priority.  As part of our continuing mission to provide you with exceptional heart care, we have created designated Provider Care Teams.  These Care Teams include your primary Cardiologist (physician) and Advanced Practice Providers (APPs -  Physician Assistants and Nurse Practitioners) who all work together to provide you  with the care you need, when you need it.  We recommend signing up for the patient portal called "MyChart".  Sign up information is provided on this After Visit Summary.  MyChart is used to connect with patients for Virtual Visits (Telemedicine).  Patients are able to view lab/test results, encounter notes, upcoming appointments, etc.  Non-urgent messages can be sent to your provider as well.   To learn more about what you can do with MyChart, go to [email or web address].    Your next appointment:   6 month(s)  The format for your next appointment:   In Person  Provider:   Thomasene RippleKardie Jeanann Balinski, DO   Other Instructions Apixaban oral tablets What is this medicine? APIXABAN (a PIX a ban) is an anticoagulant (blood thinner). It is used to lower the chance of stroke in people with a medical condition called atrial fibrillation. It is also used to treat or prevent blood clots in the lungs or in the veins. This medicine may be used for other purposes; ask your health care provider or pharmacist if you have questions. COMMON BRAND NAME(S): Eliquis What should I tell my health care provider before I take this medicine? They need to know if you have any of these conditions:  antiphospholipid antibody syndrome  bleeding disorders  bleeding in the brain  blood in your stools (black or tarry stools) or if you have blood in your vomit  history of blood clots  history of stomach bleeding  kidney disease  liver disease  mechanical heart valve  an unusual or  allergic reaction to apixaban, other medicines, foods, dyes, or preservatives  pregnant or trying to get pregnant  breast-feeding How should I use this medicine? Take this medicine by mouth with a glass of water. Follow the directions on the prescription label. You can take it with or without food. If it upsets your stomach, take it with food. Take your medicine at regular intervals. Do not take it more often than directed. Do not stop taking except on your doctor's advice. Stopping this medicine may increase your risk of a blood clot. Be sure to refill your prescription before you run out of medicine. Talk to your pediatrician regarding the use of this medicine in children. Special care may be needed. Overdosage: If you think you have taken too much of this medicine contact a poison control center or emergency room at once. NOTE: This medicine is only for you. Do not share this medicine with others. What if I miss a dose? If you miss a dose, take it as soon as you can. If it is almost time for your next dose, take only that dose. Do not take double or extra doses. What may interact with this medicine? This medicine may interact with the following:  aspirin and aspirin-like medicines  certain medicines for fungal infections like ketoconazole and itraconazole  certain medicines for seizures like carbamazepine and phenytoin  certain medicines that treat or prevent blood clots like warfarin, enoxaparin, and dalteparin  clarithromycin  NSAIDs, medicines for pain and inflammation, like ibuprofen or naproxen  rifampin  ritonavir  St. John's wort This list may not describe all possible interactions. Give your health care provider a list of all the medicines, herbs, non-prescription drugs, or dietary supplements you use. Also tell them if you smoke, drink alcohol, or use illegal drugs. Some items may interact with your medicine. What should I watch for while using this medicine? Visit your  healthcare professional for regular checks on your  progress. You may need blood work done while you are taking this medicine. Your condition will be monitored carefully while you are receiving this medicine. It is important not to miss any appointments. Avoid sports and activities that might cause injury while you are using this medicine. Severe falls or injuries can cause unseen bleeding. Be careful when using sharp tools or knives. Consider using an Neurosurgeon. Take special care brushing or flossing your teeth. Report any injuries, bruising, or red spots on the skin to your healthcare professional. If you are going to need surgery or other procedure, tell your healthcare professional that you are taking this medicine. Wear a medical ID bracelet or chain. Carry a card that describes your disease and details of your medicine and dosage times. What side effects may I notice from receiving this medicine? Side effects that you should report to your doctor or health care professional as soon as possible:  allergic reactions like skin rash, itching or hives, swelling of the face, lips, or tongue  signs and symptoms of bleeding such as bloody or black, tarry stools; red or dark-brown urine; spitting up blood or brown material that looks like coffee grounds; red spots on the skin; unusual bruising or bleeding from the eye, gums, or nose  signs and symptoms of a blood clot such as chest pain; shortness of breath; pain, swelling, or warmth in the leg  signs and symptoms of a stroke such as changes in vision; confusion; trouble speaking or understanding; severe headaches; sudden numbness or weakness of the face, arm or leg; trouble walking; dizziness; loss of coordination This list may not describe all possible side effects. Call your doctor for medical advice about side effects. You may report side effects to FDA at 1-800-FDA-1088. Where should I keep my medicine? Keep out of the reach of children. Store  at room temperature between 20 and 25 degrees C (68 and 77 degrees F). Throw away any unused medicine after the expiration date. NOTE: This sheet is a summary. It may not cover all possible information. If you have questions about this medicine, talk to your doctor, pharmacist, or health care provider.  2020 Elsevier/Gold Standard (2017-09-06 17:39:34)      Adopting a Healthy Lifestyle.  Know what a healthy weight is for you (roughly BMI <25) and aim to maintain this   Aim for 7+ servings of fruits and vegetables daily   65-80+ fluid ounces of water or unsweet tea for healthy kidneys   Limit to max 1 drink of alcohol per day; avoid smoking/tobacco   Limit animal fats in diet for cholesterol and heart health - choose grass fed whenever available   Avoid highly processed foods, and foods high in saturated/trans fats   Aim for low stress - take time to unwind and care for your mental health   Aim for 150 min of moderate intensity exercise weekly for heart health, and weights twice weekly for bone health   Aim for 7-9 hours of sleep daily   When it comes to diets, agreement about the perfect plan isnt easy to find, even among the experts. Experts at the [facility] of [employer] developed an idea known as the Healthy Eating Plate. Just imagine a plate divided into logical, healthy portions.   The emphasis is on diet quality:   Load up on vegetables and fruits - one-half of your plate: Aim for color and variety, and remember that potatoes dont count.   Go for whole grains -  one-quarter of your plate: Whole wheat, barley, wheat berries, quinoa, oats, brown rice, and foods made with them. If you want pasta, go with whole wheat pasta.   Protein power - one-quarter of your plate: Fish, chicken, beans, and nuts are all healthy, versatile protein sources. Limit red meat.   The diet, however, does go beyond the plate, offering a few other suggestions.   Use healthy plant oils,  such as olive, canola, soy, corn, sunflower and peanut. Check the labels, and avoid partially hydrogenated oil, which have unhealthy trans fats.   If youre thirsty, drink water. Coffee and tea are good in moderation, but skip sugary drinks and limit milk and dairy products to one or two daily servings.   The type of carbohydrate in the diet is more important than the amount. Some sources of carbohydrates, such as vegetables, fruits, whole grains, and beans-are healthier than others.   Finally, stay active  Signed, Thomasene Ripple, DO  08/29/2019 10:44 AM    [facility]

## 2019-08-29 NOTE — Patient Instructions (Addendum)
Medication Instructions:  None ordered  *If you need a refill on your cardiac medications before your next appointment, please call your pharmacy*   Lab Work: None ordered If you have labs (blood work) drawn today and your tests are completely normal, you will receive your results only by: Marland Kitchen MyChart Message (if you have MyChart) OR . A paper copy in the mail If you have any lab test that is abnormal or we need to change your treatment, we will call you to review the results.   Testing/Procedures: None ordered   Follow-Up: At [facility], you and your health needs are our priority.  As part of our continuing mission to provide you with exceptional heart care, we have created designated Provider Care Teams.  These Care Teams include your primary Cardiologist (physician) and Advanced Practice Providers (APPs -  Physician Assistants and Nurse Practitioners) who all work together to provide you with the care you need, when you need it.  We recommend signing up for the patient portal called "MyChart".  Sign up information is provided on this After Visit Summary.  MyChart is used to connect with patients for Virtual Visits (Telemedicine).  Patients are able to view lab/test results, encounter notes, upcoming appointments, etc.  Non-urgent messages can be sent to your provider as well.   To learn more about what you can do with MyChart, go to [email or web address].    Your next appointment:   6 month(s)  The format for your next appointment:   In Person  Provider:   Thomasene Ripple, DO   Other Instructions Apixaban oral tablets What is this medicine? APIXABAN (a PIX a ban) is an anticoagulant (blood thinner). It is used to lower the chance of stroke in people with a medical condition called atrial fibrillation. It is also used to treat or prevent blood clots in the lungs or in the veins. This medicine may be used for other purposes; ask your health care provider or pharmacist if you  have questions. COMMON BRAND NAME(S): Eliquis What should I tell my health care provider before I take this medicine? They need to know if you have any of these conditions:  antiphospholipid antibody syndrome  bleeding disorders  bleeding in the brain  blood in your stools (black or tarry stools) or if you have blood in your vomit  history of blood clots  history of stomach bleeding  kidney disease  liver disease  mechanical heart valve  an unusual or allergic reaction to apixaban, other medicines, foods, dyes, or preservatives  pregnant or trying to get pregnant  breast-feeding How should I use this medicine? Take this medicine by mouth with a glass of water. Follow the directions on the prescription label. You can take it with or without food. If it upsets your stomach, take it with food. Take your medicine at regular intervals. Do not take it more often than directed. Do not stop taking except on your doctor's advice. Stopping this medicine may increase your risk of a blood clot. Be sure to refill your prescription before you run out of medicine. Talk to your pediatrician regarding the use of this medicine in children. Special care may be needed. Overdosage: If you think you have taken too much of this medicine contact a poison control center or emergency room at once. NOTE: This medicine is only for you. Do not share this medicine with others. What if I miss a dose? If you miss a dose, take it as soon as you can.  If it is almost time for your next dose, take only that dose. Do not take double or extra doses. What may interact with this medicine? This medicine may interact with the following:  aspirin and aspirin-like medicines  certain medicines for fungal infections like ketoconazole and itraconazole  certain medicines for seizures like carbamazepine and phenytoin  certain medicines that treat or prevent blood clots like warfarin, enoxaparin, and  dalteparin  clarithromycin  NSAIDs, medicines for pain and inflammation, like ibuprofen or naproxen  rifampin  ritonavir  St. John's wort This list may not describe all possible interactions. Give your health care provider a list of all the medicines, herbs, non-prescription drugs, or dietary supplements you use. Also tell them if you smoke, drink alcohol, or use illegal drugs. Some items may interact with your medicine. What should I watch for while using this medicine? Visit your healthcare professional for regular checks on your progress. You may need blood work done while you are taking this medicine. Your condition will be monitored carefully while you are receiving this medicine. It is important not to miss any appointments. Avoid sports and activities that might cause injury while you are using this medicine. Severe falls or injuries can cause unseen bleeding. Be careful when using sharp tools or knives. Consider using an Neurosurgeon. Take special care brushing or flossing your teeth. Report any injuries, bruising, or red spots on the skin to your healthcare professional. If you are going to need surgery or other procedure, tell your healthcare professional that you are taking this medicine. Wear a medical ID bracelet or chain. Carry a card that describes your disease and details of your medicine and dosage times. What side effects may I notice from receiving this medicine? Side effects that you should report to your doctor or health care professional as soon as possible:  allergic reactions like skin rash, itching or hives, swelling of the face, lips, or tongue  signs and symptoms of bleeding such as bloody or black, tarry stools; red or dark-brown urine; spitting up blood or brown material that looks like coffee grounds; red spots on the skin; unusual bruising or bleeding from the eye, gums, or nose  signs and symptoms of a blood clot such as chest pain; shortness of breath; pain,  swelling, or warmth in the leg  signs and symptoms of a stroke such as changes in vision; confusion; trouble speaking or understanding; severe headaches; sudden numbness or weakness of the face, arm or leg; trouble walking; dizziness; loss of coordination This list may not describe all possible side effects. Call your doctor for medical advice about side effects. You may report side effects to FDA at 1-800-FDA-1088. Where should I keep my medicine? Keep out of the reach of children. Store at room temperature between 20 and 25 degrees C (68 and 77 degrees F). Throw away any unused medicine after the expiration date. NOTE: This sheet is a summary. It may not cover all possible information. If you have questions about this medicine, talk to your doctor, pharmacist, or health care provider.  2020 Elsevier/Gold Standard (2017-09-06 17:39:34)

## 2019-10-20 ENCOUNTER — Other Ambulatory Visit: Payer: Self-pay | Admitting: Cardiology

## 2019-10-21 ENCOUNTER — Ambulatory Visit: Payer: Medicare HMO | Admitting: Cardiology

## 2019-10-21 ENCOUNTER — Other Ambulatory Visit: Payer: Self-pay

## 2019-10-21 ENCOUNTER — Encounter: Payer: Self-pay | Admitting: Cardiology

## 2019-10-21 VITALS — BP 102/70 | HR 76 | Ht 74.0 in | Wt 223.0 lb

## 2019-10-21 DIAGNOSIS — I4819 Other persistent atrial fibrillation: Secondary | ICD-10-CM | POA: Diagnosis not present

## 2019-10-21 DIAGNOSIS — I480 Paroxysmal atrial fibrillation: Secondary | ICD-10-CM

## 2019-10-21 DIAGNOSIS — Z01812 Encounter for preprocedural laboratory examination: Secondary | ICD-10-CM

## 2019-10-21 NOTE — Patient Instructions (Addendum)
Medication Instructions:  Your physician recommends that you continue on your current medications as directed. Please refer to the Current Medication list given to you today.  *If you need a refill on your cardiac medications before your next appointment, please call your pharmacy*   Lab Work: Pre procedure labs today:  BMP & CBC If you have labs (blood work) drawn today and your tests are completely normal, you will receive your results only by: Marland Kitchen MyChart Message (if you have MyChart) OR . A paper copy in the mail If you have any lab test that is abnormal or we need to change your treatment, we will call you to review the results.   Testing/Procedures: Your physician has requested that you have cardiac CT within 7 days PRIOR to your ablation. Cardiac computed tomography (CT) is a painless test that uses an x-ray machine to take clear, detailed pictures of your heart.  Please follow instruction below located under "other instructions". You will get a call from our office to schedule the date for this test.  Your physician has recommended that you have an ablation. Catheter ablation is a medical procedure used to treat some cardiac arrhythmias (irregular heartbeats). During catheter ablation, a long, thin, flexible tube is put into a blood vessel in your groin (upper thigh), or neck. This tube is called an ablation catheter. It is then guided to your heart through the blood vessel. Radio frequency waves destroy small areas of heart tissue where abnormal heartbeats may cause an arrhythmia to start. Please follow instruction below located under "other instructions".   Follow-Up: At [facility], you and your health needs are our priority.  As part of our continuing mission to provide you with exceptional heart care, we have created designated Provider Care Teams.  These Care Teams include your primary Cardiologist (physician) and Advanced Practice Providers (APPs -  Physician Assistants and  Nurse Practitioners) who all work together to provide you with the care you need, when you need it.  We recommend signing up for the patient portal called "MyChart".  Sign up information is provided on this After Visit Summary.  MyChart is used to connect with patients for Virtual Visits (Telemedicine).  Patients are able to view lab/test results, encounter notes, upcoming appointments, etc.  Non-urgent messages can be sent to your provider as well.   To learn more about what you can do with MyChart, go to [email or web address].    Your next appointment:   1 month(s) after your ablation on 11/15/19  The format for your next appointment:   In Person  Provider:   AFib clinic   Thank you for choosing [facility]!!   Trinidad Curet, RN [phone]    Other Instructions    CT INSTRUCTIONS Your cardiac CT will be scheduled at:  [facility], [place] [address] [employer], [place] [address] [phone]   Please arrive at the [facility] main entrance of [facility] at ________________ on _________________, please arrive 30 minutes prior to test start time. Proceed to the [facility] Radiology Department (first floor) to check-in and test prep.  Please follow these instructions carefully (unless otherwise directed):  Hold all erectile dysfunction medications at least 3 days (72 hrs) prior to test.  On the Night Before the Test: . Be sure to Drink plenty of water. . Do not consume any caffeinated/decaffeinated beverages or chocolate 12 hours prior to your test. . Do not take any antihistamines 12 hours prior to your test.  On the Day of the Test: . Drink plenty of water. Do not drink any water within one hour of the test. . Do not eat any food 4 hours prior to the test. . You may take your regular medications prior to the test.  . Take metoprolol (Lopressor) 100 mg  two hours prior to test.  Hold your Toprol that day. Marland Kitchen HOLD Furosemide/Hydrochlorothiazide  morning of the test.      After the Test: . Drink plenty of water. . After receiving IV contrast, you may experience a mild flushed feeling. This is normal. . On occasion, you may experience a mild rash up to 24 hours after the test. This is not dangerous. If this occurs, you can take Benadryl 25 mg and increase your fluid intake. . If you experience trouble breathing, this can be serious. If it is severe call 911 IMMEDIATELY. If it is mild, please call our office. . If you take any of these medications: Glipizide/Metformin, Avandament, Glucavance, please do not take 48 hours after completing test unless otherwise instructed.   Once we have confirmed authorization from your insurance company, we will call you to set up a date and time for your test. Based on how quickly your insurance processes prior authorizations requests, please allow up to 4 weeks to be contacted for scheduling your Cardiac CT appointment. Be advised that routine Cardiac CT appointments could be scheduled as many as 8 weeks after your provider has ordered it.  For non-scheduling related questions, please contact the cardiac imaging nurse navigator should you have any questions/concerns: Marchia Bond, Cardiac Imaging Nurse Navigator Burley Saver, Interim Cardiac Imaging Nurse [place] [facility] Direct Office Dial: [phone]   For scheduling needs, including cancellations and rescheduling, please call Vivien Rota at [phone], option 3.      Electrophysiology/Ablation Procedure Instructions   You are scheduled for a(n)  ablation on 11/15/2019 with Dr. Allegra Lai.   1.   Pre procedure testing-             A.  LAB WORK --- On 11/11/212  for your pre procedure blood work.                 B. COVID TEST-- On 11/13/2019 @ 1:00 pm -  This is a Drive Up Visit at [address] [place], [place] [address].  Someone will direct you to the appropriate testing line. Stay in your car and someone will  be with you shortly.   After you are tested please go home and self quarantine until the day of your procedure.     2. On the day of your procedure 11/15/2019 you will go to [facility] [phone]) at 5:30 am.  Dennis Bast will go to the main entrance [address] [employer]) and enter where the [employer] are.  Your driver will drop you off and you will head down the hallway to ADMITTING.  You may have one support person come in to the hospital with you.  They will be asked to wait in the waiting room.   3.   Do not eat or drink after midnight prior to your procedure.   4.   Do not miss any doses of your blood thinner prior to the morning of your procedure or your procedure will need to be rescheduled.       Do NOT take any medications the morning of your procedure.   5.  Plan for an  overnight stay (if you go home same day, you will need someone to stay with you for 24 hours after the procedure).  If you use your phone frequently bring your phone charger.   6. You will follow up with the AFIB clinic 4 weeks after your procedure.  You will follow up with Dr. Curt Bears  3 months after your procedure.  These appointments will be made for you.   * If you have ANY questions please call the office [phone] and ask for Carl Bleecker RN or send me a MyChart message   * Occasionally, EP Studies and ablations can become lengthy.  Please make your family aware of this before your procedure starts.  Average time ranges from 2-8 hours for EP studies/ablations.  Your physician will call your family after the procedure with the results.          Cardiac Ablation Cardiac ablation is a procedure to disable (ablate) a small amount of heart tissue in very specific places. The heart has many electrical connections. Sometimes these connections are abnormal and can cause the heart to beat very fast or irregularly. Ablating some of the problem areas can improve the heart rhythm or return it to normal. Ablation may  be done for people who:  Have Wolff-Parkinson-White syndrome.  Have fast heart rhythms (tachycardia).  Have taken medicines for an abnormal heart rhythm (arrhythmia) that were not effective or caused side effects.  Have a high-risk heartbeat that may be life-threatening. During the procedure, a small incision is made in the neck or the groin, and a long, thin, flexible tube (catheter) is inserted into the incision and moved to the heart. Small devices (electrodes) on the tip of the catheter will send out electrical currents. A type of X-ray (fluoroscopy) will be used to help guide the catheter and to provide images of the heart. Tell a health care provider about:  Any allergies you have.  All medicines you are taking, including vitamins, herbs, eye drops, creams, and over-the-counter medicines.  Any problems you or family members have had with anesthetic medicines.  Any blood disorders you have.  Any surgeries you have had.  Any medical conditions you have, such as kidney failure.  Whether you are pregnant or may be pregnant. What are the risks? Generally, this is a safe procedure. However, problems may occur, including:  Infection.  Bruising and bleeding at the catheter insertion site.  Bleeding into the chest, especially into the sac that surrounds the heart. This is a serious complication.  Stroke or blood clots.  Damage to other structures or organs.  Allergic reaction to medicines or dyes.  Need for a permanent pacemaker if the normal electrical system is damaged. A pacemaker is a small computer that sends electrical signals to the heart and helps your heart beat normally.  The procedure not being fully effective. This may not be recognized until months later. Repeat ablation procedures are sometimes required. What happens before the procedure?  Follow instructions from your health care provider about eating or drinking restrictions.  Ask your health care provider  about: ? Changing or stopping your regular medicines. This is especially important if you are taking diabetes medicines or blood thinners. ? Taking medicines such as aspirin and ibuprofen. These medicines can thin your blood. Do not take these medicines before your procedure if your health care provider instructs you not to.  Plan to have someone take you home from the hospital or clinic.  If you will be going  home right after the procedure, plan to have someone with you for 24 hours. What happens during the procedure?  To lower your risk of infection: ? Your health care team will wash or sanitize their hands. ? Your skin will be washed with soap. ? Hair may be removed from the incision area.  An IV tube will be inserted into one of your veins.  You will be given a medicine to help you relax (sedative).  The skin on your neck or groin will be numbed.  An incision will be made in your neck or your groin.  A needle will be inserted through the incision and into a large vein in your neck or groin.  A catheter will be inserted into the needle and moved to your heart.  Dye may be injected through the catheter to help your surgeon see the area of the heart that needs treatment.  Electrical currents will be sent from the catheter to ablate heart tissue in desired areas. There are three types of energy that may be used to ablate heart tissue: ? Heat (radiofrequency energy). ? Laser energy. ? Extreme cold (cryoablation).  When the necessary tissue has been ablated, the catheter will be removed.  Pressure will be held on the catheter insertion area to prevent excessive bleeding.  A bandage (dressing) will be placed over the catheter insertion area. The procedure may vary among health care providers and hospitals. What happens after the procedure?  Your blood pressure, heart rate, breathing rate, and blood oxygen level will be monitored until the medicines you were given have worn  off.  Your catheter insertion area will be monitored for bleeding. You will need to lie still for a few hours to ensure that you do not bleed from the catheter insertion area.  Do not drive for 24 hours or as long as directed by your health care provider. Summary  Cardiac ablation is a procedure to disable (ablate) a small amount of heart tissue in very specific places. Ablating some of the problem areas can improve the heart rhythm or return it to normal.  During the procedure, electrical currents will be sent from the catheter to ablate heart tissue in desired areas. This information is not intended to replace advice given to you by your health care provider. Make sure you discuss any questions you have with your health care provider. Document Revised: 06/19/2017 Document Reviewed: 11/16/2015 Elsevier Patient Education  [place].

## 2019-10-21 NOTE — Telephone Encounter (Signed)
Refill sent to pharmacy.   

## 2019-10-21 NOTE — Progress Notes (Signed)
Electrophysiology Office Note   Date:  10/21/2019   ID:  Dylan Gordon, DOB Dec 03, 1950, MRN [id number]  PCP:  Paulina Fusi, MD  Cardiologist:  Tobb Primary Electrophysiologist:  Adarryl Goldammer Jorja Loa, MD    Chief Complaint: atrial flutter   History of Present Illness: Dylan Gordon is a 69 y.o. male who is being seen today for the evaluation of atrial flutter at the request of Tobb, Kardie, DO. Presenting today for electrophysiology evaluation.  He has a history of CKD stage III, hyperlipidemia, and atrial flutter.  He wore a Zio patch that showed continued atrial flutter.  He is currently on Eliquis.  He had a cardioversion October 2020.  Unfortunately he did revert back to atrial flutter.  Today, he denies symptoms of palpitations, chest pain, shortness of breath, orthopnea, PND, lower extremity edema, claudication, dizziness, presyncope, syncope, bleeding, or neurologic sequela. The patient is tolerating medications without difficulties.  After his cardioversion, he felt well.  When he reverted back to atrial flutter, he had weakness and fatigue as well as some mild shortness of breath.  He comes in the clinic today in atrial fibrillation.  He is unsure when he converted to atrial fibrillation.  This is a new diagnosis for him.   Past Medical History:  Diagnosis Date  . Benign prostatic hyperplasia   . Calculus of kidney   . Chronic kidney disease, stage III (moderate) (HCC)   . Enlarged prostate with lower urinary tract symptoms (LUTS)   . Erectile dysfunction   . Hyperlipidemia    Past Surgical History:  Procedure Laterality Date  . PROSTATE BIOPSY    . TONSILLECTOMY  1954     Current Outpatient Medications  Medication Sig Dispense Refill  . apixaban (ELIQUIS) 5 MG TABS tablet Take 1 tablet (5 mg total) by mouth 2 (two) times daily. 60 tablet 5  . atorvastatin (LIPITOR) 20 MG tablet Take 20 mg by mouth daily.    . finasteride (PROSCAR) 5 MG tablet Take 5 mg by  mouth daily.    . metoprolol succinate (TOPROL-XL) 25 MG 24 hr tablet TAKE 1/2 TABLET BY MOUTH EVERY DAY 45 tablet 1  . Multiple Vitamin (MULTIVITAMIN WITH MINERALS) TABS tablet Take 1 tablet by mouth 2 (two) times a week.     . Omega-3 Fatty Acids (FISH OIL) 1000 MG CAPS Take 1,000 mg by mouth 2 (two) times a week.     . tamsulosin (FLOMAX) 0.4 MG CAPS capsule Take 0.4 mg by mouth daily after supper.     No current facility-administered medications for this visit.    Allergies:   Patient has no known allergies.   Social History:  The patient  reports that he has been smoking cigarettes. He has a 26.50 pack-year smoking history. He has never used smokeless tobacco. He reports previous alcohol use. He reports that he does not use drugs.   Family History:  The patient's family history includes Arrhythmia in his mother; Asthma in his mother; Colon cancer in his maternal aunt; Emphysema in his father.    ROS:  Please see the history of present illness.   Otherwise, review of systems is positive for none.   All other systems are reviewed and negative.    PHYSICAL EXAM: VS:  BP 102/70   Pulse 76   Ht 6\' 2"  (1.88 m)   Wt 223 lb (101.2 kg)   SpO2 98%   BMI 28.63 kg/m  , BMI Body mass index is 28.63 kg/m. GEN: Well  nourished, well developed, in no acute distress  HEENT: normal  Neck: no JVD, carotid bruits, or masses Cardiac: irregular; no murmurs, rubs, or gallops,no edema  Respiratory:  clear to auscultation bilaterally, normal work of breathing GI: soft, nontender, nondistended, + BS MS: no deformity or atrophy  Skin: warm and dry,  Neuro:  Strength and sensation are intact Psych: euthymic mood, full affect  EKG:  EKG is ordered today. Personal review of the ekg ordered shows atrial fibrillation, rate 76  Recent Labs: 11/01/2018: BUN 15; Creatinine, Ser 1.25; Hemoglobin 14.5; Platelets 222; Potassium 4.9; Sodium 141    Lipid Panel  No results found for: CHOL, TRIG, HDL,  CHOLHDL, VLDL, LDLCALC, LDLDIRECT   Wt Readings from Last 3 Encounters:  10/21/19 223 lb (101.2 kg)  08/29/19 225 lb (102.1 kg)  03/01/19 222 lb (100.7 kg)      Other studies Reviewed: Additional studies/ records that were reviewed today include: TTE 11/01/18  Review of the above records today demonstrates:  1. Left ventricular ejection fraction, by visual estimation, is 60 to  65%. The left ventricle has normal function. Normal left ventricular size.  There is no left ventricular hypertrophy.  2. Left ventricular diastolic Doppler parameters are indeterminate  pattern of LV diastolic filling.  3. Global right ventricle has normal systolic function.The right  ventricular size is normal. No increase in right ventricular wall  thickness.  4. Left atrial size was normal.  5. Right atrial size was normal.  6. The mitral valve is normal in structure. Trace mitral valve  regurgitation. No evidence of mitral stenosis.  7. The tricuspid valve is normal in structure. Tricuspid valve  regurgitation is trivial.  8. The aortic valve is normal in structure. Aortic valve regurgitation  was not visualized by color flow Doppler. Structurally normal aortic  valve, with no evidence of sclerosis or stenosis.  9. The pulmonic valve was normal in structure. Pulmonic valve  regurgitation is not visualized by color flow Doppler.  10. Normal pulmonary artery systolic pressure.   Cardiac monitor 10/22/2018 personally reviewed 100% burden of atrial flutter.  ASSESSMENT AND PLAN:  1.  Persistent atrial fibrillation/typical atrial flutter: Currently on Eliquis.  CHA2DS2-VASc of 2.  He has had a cardioversion but unfortunately has gone back into atrial flutter.  We have discussed medication management versus ablation.  Risks of ablation include bleeding, tamponade, heart block, stroke, among others.  He would like to be off of his anticoagulation, and I do feel that ablation is his best chance to  have that happen.  He would also like to avoid antiarrhythmics.  He understands these risks and is agreed to the procedure.   2.  Hypertension: Currently well controlled  3.  Hyperlipidemia: Continue atorvastatin per primary cardiology.  Case discussed with primary cardiology  Current medicines are reviewed at length with the patient today.   The patient does not have concerns regarding his medicines.  The following changes were made today:  none  Labs/ tests ordered today include:  Orders Placed This Encounter  Procedures  . CT CARDIAC MORPH/PULM VEIN W/CM&W/O CA SCORE  . Basic metabolic panel  . CBC  . EKG 12-Lead     Disposition:   FU with Shivam Mestas 3 months  Signed, Farrell Broerman Martin Hazelyn Kallen, MD  10/21/2019 12:18 PM     [facility] [address] [place] [address] [phone] (office) [phone] (fax)  

## 2019-10-21 NOTE — H&P (View-Only) (Signed)
Electrophysiology Office Note   Date:  10/21/2019   ID:  Dylan Gordon, DOB Dylan Gordon, MRN [id number]  PCP:  Dylan Fusi, MD  Cardiologist:  Dylan Gordon:  Dylan Belen Jorja Loa, MD    Chief Complaint: atrial flutter   History of Present Illness: Dylan Gordon is a 69 y.o. male who is being seen today for the evaluation of atrial flutter at the request of Dylan, Kardie, DO. Presenting today for electrophysiology evaluation.  He has a history of CKD stage III, hyperlipidemia, and atrial flutter.  He wore a Zio patch that showed continued atrial flutter.  He is currently on Eliquis.  He had a cardioversion October 2020.  Unfortunately he did revert back to atrial flutter.  Today, he denies symptoms of palpitations, chest pain, shortness of breath, orthopnea, PND, lower extremity edema, claudication, dizziness, presyncope, syncope, bleeding, or neurologic sequela. The patient is tolerating medications without difficulties.  After his cardioversion, he felt well.  When he reverted back to atrial flutter, he had weakness and fatigue as well as some mild shortness of breath.  He comes in the clinic today in atrial fibrillation.  He is unsure when he converted to atrial fibrillation.  This is a new diagnosis for him.   Past Medical History:  Diagnosis Date  . Benign prostatic hyperplasia   . Calculus of kidney   . Chronic kidney disease, stage III (moderate) (HCC)   . Enlarged prostate with lower urinary tract symptoms (LUTS)   . Erectile dysfunction   . Hyperlipidemia    Past Surgical History:  Procedure Laterality Date  . PROSTATE BIOPSY    . TONSILLECTOMY  1954     Current Outpatient Medications  Medication Sig Dispense Refill  . apixaban (ELIQUIS) 5 MG TABS tablet Take 1 tablet (5 mg total) by mouth 2 (two) times daily. 60 tablet 5  . atorvastatin (LIPITOR) 20 MG tablet Take 20 mg by mouth daily.    . finasteride (PROSCAR) 5 MG tablet Take 5 mg by  mouth daily.    . metoprolol succinate (TOPROL-XL) 25 MG 24 hr tablet TAKE 1/2 TABLET BY MOUTH EVERY DAY 45 tablet 1  . Multiple Vitamin (MULTIVITAMIN WITH MINERALS) TABS tablet Take 1 tablet by mouth 2 (two) times a week.     . Omega-3 Fatty Acids (FISH OIL) 1000 MG CAPS Take 1,000 mg by mouth 2 (two) times a week.     . tamsulosin (FLOMAX) 0.4 MG CAPS capsule Take 0.4 mg by mouth daily after supper.     No current facility-administered medications for this visit.    Allergies:   Patient has no known allergies.   Social History:  The patient  reports that he has been smoking cigarettes. He has a 26.50 pack-year smoking history. He has never used smokeless tobacco. He reports previous alcohol use. He reports that he does not use drugs.   Family History:  The patient's family history includes Arrhythmia in his mother; Asthma in his mother; Colon cancer in his maternal aunt; Emphysema in his father.    ROS:  Please see the history of present illness.   Otherwise, review of systems is positive for none.   All other systems are reviewed and negative.    PHYSICAL EXAM: VS:  BP 102/70   Pulse 76   Ht 6\' 2"  (1.88 m)   Wt 223 lb (101.2 kg)   SpO2 98%   BMI 28.63 kg/m  , BMI Body mass index is 28.63 kg/m. GEN: Well  nourished, well developed, in no acute distress  HEENT: normal  Neck: no JVD, carotid bruits, or masses Cardiac: irregular; no murmurs, rubs, or gallops,no edema  Respiratory:  clear to auscultation bilaterally, normal work of breathing GI: soft, nontender, nondistended, + BS MS: no deformity or atrophy  Skin: warm and dry,  Neuro:  Strength and sensation are intact Psych: euthymic mood, full affect  EKG:  EKG is ordered today. Personal review of the ekg ordered shows atrial fibrillation, rate 76  Recent Labs: 11/01/2018: BUN 15; Creatinine, Ser 1.25; Hemoglobin 14.5; Platelets 222; Potassium 4.9; Sodium 141    Lipid Panel  No results found for: CHOL, TRIG, HDL,  CHOLHDL, VLDL, LDLCALC, LDLDIRECT   Wt Readings from Last 3 Encounters:  10/21/19 223 lb (101.2 kg)  08/29/19 225 lb (102.1 kg)  03/01/19 222 lb (100.7 kg)      Other studies Reviewed: Additional studies/ records that were reviewed today include: TTE 11/01/18  Review of the above records today demonstrates:  1. Left ventricular ejection fraction, by visual estimation, is 60 to  65%. The left ventricle has normal function. Normal left ventricular size.  There is no left ventricular hypertrophy.  2. Left ventricular diastolic Doppler parameters are indeterminate  pattern of LV diastolic filling.  3. Global right ventricle has normal systolic function.The right  ventricular size is normal. No increase in right ventricular wall  thickness.  4. Left atrial size was normal.  5. Right atrial size was normal.  6. The mitral valve is normal in structure. Trace mitral valve  regurgitation. No evidence of mitral stenosis.  7. The tricuspid valve is normal in structure. Tricuspid valve  regurgitation is trivial.  8. The aortic valve is normal in structure. Aortic valve regurgitation  was not visualized by color flow Doppler. Structurally normal aortic  valve, with no evidence of sclerosis or stenosis.  9. The pulmonic valve was normal in structure. Pulmonic valve  regurgitation is not visualized by color flow Doppler.  10. Normal pulmonary artery systolic pressure.   Cardiac monitor 10/22/2018 personally reviewed 100% burden of atrial flutter.  ASSESSMENT AND PLAN:  1.  Persistent atrial fibrillation/typical atrial flutter: Currently on Eliquis.  CHA2DS2-VASc of 2.  He has had a cardioversion but unfortunately has gone back into atrial flutter.  We have discussed medication management versus ablation.  Risks of ablation include bleeding, tamponade, heart block, stroke, among others.  He would like to be off of his anticoagulation, and I do feel that ablation is his best chance to  have that happen.  He would also like to avoid antiarrhythmics.  He understands these risks and is agreed to the procedure.   2.  Hypertension: Currently well controlled  3.  Hyperlipidemia: Continue atorvastatin per primary cardiology.  Case discussed with primary cardiology  Current medicines are reviewed at length with the patient today.   The patient does not have concerns regarding his medicines.  The following changes were made today:  none  Labs/ tests ordered today include:  Orders Placed This Encounter  Procedures  . CT CARDIAC MORPH/PULM VEIN W/CM&W/O CA SCORE  . Basic metabolic panel  . CBC  . EKG 12-Lead     Disposition:   FU with Bridgett Hattabaugh 3 months  Signed, Patryck Kilgore Jorja Loa, MD  10/21/2019 12:18 PM     [facility] [address] [place] [address] [phone] (office) [phone] (fax)

## 2019-10-22 LAB — BASIC METABOLIC PANEL
BUN/Creatinine Ratio: 11 (ref 10–24)
BUN: 14 mg/dL (ref 8–27)
CO2: 25 mmol/L (ref 20–29)
Calcium: 9.9 mg/dL (ref 8.6–10.2)
Chloride: 104 mmol/L (ref 96–106)
Creatinine, Ser: 1.23 mg/dL (ref 0.76–1.27)
GFR calc Af Amer: 68 mL/min/{1.73_m2} (ref >59)
GFR calc non Af Amer: 59 mL/min/{1.73_m2} — ABNORMAL LOW (ref >59)
Glucose: 90 mg/dL (ref 65–99)
Potassium: 5 mmol/L (ref 3.5–5.2)
Sodium: 140 mmol/L (ref 134–144)

## 2019-10-22 LAB — CBC
Hematocrit: 45.1 % (ref 37.5–51.0)
Hemoglobin: 14.7 g/dL (ref 13.0–17.7)
MCH: 29.8 pg (ref 26.6–33.0)
MCHC: 32.6 g/dL (ref 31.5–35.7)
MCV: 92 fL (ref 79–97)
Platelets: 224 10*3/uL (ref 150–450)
RBC: 4.93 x10E6/uL (ref 4.14–5.80)
RDW: 12.5 % (ref 11.6–15.4)
WBC: 9.8 10*3/uL (ref 3.4–10.8)

## 2019-10-23 MED ORDER — METOPROLOL TARTRATE 100 MG PO TABS: ORAL_TABLET | ORAL | 0 refills | Status: DC

## 2019-10-23 NOTE — Addendum Note (Signed)
Addended by: Baird Lyons on: 10/23/2019 10:48 AM   Modules accepted: Orders

## 2019-11-04 ENCOUNTER — Telehealth: Payer: Self-pay | Admitting: Cardiology

## 2019-11-04 MED ORDER — APIXABAN 5 MG PO TABS
5.0000 mg | ORAL_TABLET | Freq: Two times a day (BID) | ORAL | 1 refills | Status: DC
Start: 2019-11-04 — End: 2020-05-25

## 2019-11-04 NOTE — Telephone Encounter (Signed)
57m 101.3kg Scr 1.23 11/04/19 Lovw/tobb 10/21/19

## 2019-11-04 NOTE — Telephone Encounter (Signed)
    Patient calling the office for samples of medication:   1.  What medication and dosage are you requesting samples for?   apixaban (ELIQUIS) 5 MG TABS tablet    2.  Are you currently out of this medication? Yes  Pt said to call his cp first if he did not answer then call his house phone

## 2019-11-05 NOTE — Telephone Encounter (Signed)
Dylan Gordon is calling back due to never receiving a callback yesterday. Please advise.

## 2019-11-05 NOTE — Telephone Encounter (Signed)
Called and spoke to pt stated that yes we have samples however they will need to apply for pt assistance or apply for a copay card.... both given with sample to the pt and the pt voiced understanding

## 2019-11-07 ENCOUNTER — Telehealth (HOSPITAL_COMMUNITY): Payer: Self-pay | Admitting: Emergency Medicine

## 2019-11-07 NOTE — Telephone Encounter (Signed)
Reaching out to patient to offer assistance regarding upcoming cardiac imaging study; pt verbalizes understanding of appt date/time, parking situation and where to check in, pre-test NPO status and medications ordered, and verified current allergies; name and call back number provided for further questions should they arise Dylan Gordon Alexandria RN Navigator Cardiac Imaging [facility] [phone] office [phone] cell  Pt holding daily TOPROL XL and taking 100mg  metoprolol tartrate 2 hr prior to scan. 

## 2019-11-08 ENCOUNTER — Ambulatory Visit (HOSPITAL_COMMUNITY)
Admission: RE | Admit: 2019-11-08 | Discharge: 2019-11-08 | Disposition: A | Discharge status: Home / Self Care | Payer: Medicare HMO | Source: Ambulatory Visit | Attending: Cardiology | Admitting: Cardiology

## 2019-11-08 DIAGNOSIS — I480 Paroxysmal atrial fibrillation: Secondary | ICD-10-CM | POA: Diagnosis present

## 2019-11-08 MED ORDER — IOHEXOL 350 MG/ML SOLN
80.0000 mL | Freq: Once | INTRAVENOUS | Status: AC | PRN
  Administered 2019-11-08: 80 mL via INTRAVENOUS

## 2019-11-13 ENCOUNTER — Other Ambulatory Visit (HOSPITAL_COMMUNITY)
Admission: RE | Admit: 2019-11-13 | Discharge: 2019-11-13 | Disposition: A | Discharge status: Home / Self Care | Payer: Medicare HMO | Source: Ambulatory Visit | Attending: Cardiology | Admitting: Cardiology

## 2019-11-13 DIAGNOSIS — Z01812 Encounter for preprocedural laboratory examination: Secondary | ICD-10-CM | POA: Diagnosis present

## 2019-11-13 DIAGNOSIS — Z20822 Contact with and (suspected) exposure to covid-19: Secondary | ICD-10-CM | POA: Insufficient documentation

## 2019-11-13 LAB — SARS CORONAVIRUS 2 (TAT 6-24 HRS): SARS Coronavirus 2: NEGATIVE

## 2019-11-14 ENCOUNTER — Telehealth: Payer: Self-pay | Admitting: Cardiology

## 2019-11-14 NOTE — Progress Notes (Signed)
Instructed patient on the following items: Arrival time 0530 Nothing to eat or drink after midnight No meds AM of procedure Responsible person to drive you home and stay with you for 24 hrs  Have you missed any doses of anti-coagulant Eliquis- hasn't missed any doses    

## 2019-11-14 NOTE — Telephone Encounter (Signed)
Patient called and wanted to know if he could eat like regular today. He is having a procedure tomorrow and has to be NPO after midnight. He is not sure about how he can eat today. Please advise

## 2019-11-14 NOTE — Telephone Encounter (Signed)
Advised to eat however he would like today. Reminded to take his Eliquis today as normal. Patient verbalized understanding and agreeable to plan.

## 2019-11-15 ENCOUNTER — Ambulatory Visit (HOSPITAL_COMMUNITY): Payer: Medicare HMO | Admitting: Certified Registered Nurse Anesthetist

## 2019-11-15 ENCOUNTER — Encounter (HOSPITAL_COMMUNITY): Payer: Self-pay | Admitting: Cardiology

## 2019-11-15 ENCOUNTER — Other Ambulatory Visit: Payer: Self-pay

## 2019-11-15 ENCOUNTER — Ambulatory Visit (HOSPITAL_COMMUNITY)
Admission: RE | Admit: 2019-11-15 | Discharge: 2019-11-15 | Disposition: A | Discharge status: Home / Self Care | Payer: Medicare HMO | Attending: Cardiology | Admitting: Cardiology

## 2019-11-15 ENCOUNTER — Encounter (HOSPITAL_COMMUNITY)
Admission: RE | Disposition: A | Discharge status: Home / Self Care | Payer: Self-pay | Source: Home / Self Care | Attending: Cardiology

## 2019-11-15 DIAGNOSIS — F17210 Nicotine dependence, cigarettes, uncomplicated: Secondary | ICD-10-CM | POA: Insufficient documentation

## 2019-11-15 DIAGNOSIS — I129 Hypertensive chronic kidney disease with stage 1 through stage 4 chronic kidney disease, or unspecified chronic kidney disease: Secondary | ICD-10-CM | POA: Insufficient documentation

## 2019-11-15 DIAGNOSIS — Z7901 Long term (current) use of anticoagulants: Secondary | ICD-10-CM | POA: Diagnosis not present

## 2019-11-15 DIAGNOSIS — E785 Hyperlipidemia, unspecified: Secondary | ICD-10-CM | POA: Insufficient documentation

## 2019-11-15 DIAGNOSIS — N183 Chronic kidney disease, stage 3 unspecified: Secondary | ICD-10-CM | POA: Insufficient documentation

## 2019-11-15 DIAGNOSIS — I4819 Other persistent atrial fibrillation: Secondary | ICD-10-CM | POA: Diagnosis present

## 2019-11-15 DIAGNOSIS — I483 Typical atrial flutter: Secondary | ICD-10-CM | POA: Diagnosis not present

## 2019-11-15 HISTORY — PX: ATRIAL FIBRILLATION ABLATION: EP1191

## 2019-11-15 LAB — POCT ACTIVATED CLOTTING TIME
Activated Clotting Time: 268 seconds
Activated Clotting Time: 307 seconds

## 2019-11-15 SURGERY — ATRIAL FIBRILLATION ABLATION
Anesthesia: General

## 2019-11-15 MED ORDER — OXYCODONE HCL 5 MG PO TABS: 5.0000 mg | ORAL_TABLET | Freq: Once | ORAL | Status: DC | PRN

## 2019-11-15 MED ORDER — PROTAMINE SULFATE 10 MG/ML IV SOLN
INTRAVENOUS | Status: DC | PRN
  Administered 2019-11-15: 40 mg via INTRAVENOUS

## 2019-11-15 MED ORDER — ACETAMINOPHEN 325 MG PO TABS: 650.0000 mg | ORAL_TABLET | ORAL | Status: DC | PRN

## 2019-11-15 MED ORDER — FENTANYL CITRATE (PF) 100 MCG/2ML IJ SOLN: 25.0000 ug | INTRAMUSCULAR | Status: DC | PRN

## 2019-11-15 MED ORDER — SODIUM CHLORIDE 0.9% FLUSH: 3.0000 mL | Freq: Two times a day (BID) | INTRAVENOUS | Status: DC

## 2019-11-15 MED ORDER — OXYCODONE HCL 5 MG/5ML PO SOLN: 5.0000 mg | Freq: Once | ORAL | Status: DC | PRN

## 2019-11-15 MED ORDER — HEPARIN SODIUM (PORCINE) 1000 UNIT/ML IJ SOLN
INTRAMUSCULAR | Status: AC
  Filled 2019-11-15: qty 1

## 2019-11-15 MED ORDER — DOBUTAMINE IN D5W 4-5 MG/ML-% IV SOLN
INTRAVENOUS | Status: AC
  Filled 2019-11-15: qty 250

## 2019-11-15 MED ORDER — PHENYLEPHRINE HCL-NACL 10-0.9 MG/250ML-% IV SOLN
INTRAVENOUS | Status: DC | PRN
  Administered 2019-11-15: 25 ug/min via INTRAVENOUS

## 2019-11-15 MED ORDER — ONDANSETRON HCL 4 MG/2ML IJ SOLN: 4.0000 mg | Freq: Four times a day (QID) | INTRAMUSCULAR | Status: DC | PRN

## 2019-11-15 MED ORDER — SODIUM CHLORIDE 0.9% FLUSH: 3.0000 mL | INTRAVENOUS | Status: DC | PRN

## 2019-11-15 MED ORDER — ONDANSETRON HCL 4 MG/2ML IJ SOLN: 4.0000 mg | Freq: Once | INTRAMUSCULAR | Status: DC | PRN

## 2019-11-15 MED ORDER — LIDOCAINE 2% (20 MG/ML) 5 ML SYRINGE
INTRAMUSCULAR | Status: DC | PRN
  Administered 2019-11-15: 40 mg via INTRAVENOUS

## 2019-11-15 MED ORDER — SUGAMMADEX SODIUM 200 MG/2ML IV SOLN
INTRAVENOUS | Status: DC | PRN
  Administered 2019-11-15: 200 mg via INTRAVENOUS

## 2019-11-15 MED ORDER — FENTANYL CITRATE (PF) 100 MCG/2ML IJ SOLN
INTRAMUSCULAR | Status: DC | PRN
Start: 2019-11-15 — End: 2019-11-15
  Administered 2019-11-15: 50 ug via INTRAVENOUS

## 2019-11-15 MED ORDER — HEPARIN (PORCINE) IN NACL 1000-0.9 UT/500ML-% IV SOLN
INTRAVENOUS | Status: AC
  Filled 2019-11-15: qty 2500

## 2019-11-15 MED ORDER — PROPOFOL 10 MG/ML IV BOLUS
INTRAVENOUS | Status: DC | PRN
  Administered 2019-11-15: 150 mg via INTRAVENOUS
  Administered 2019-11-15: 60 mg via INTRAVENOUS

## 2019-11-15 MED ORDER — PHENYLEPHRINE 40 MCG/ML (10ML) SYRINGE FOR IV PUSH (FOR BLOOD PRESSURE SUPPORT)
PREFILLED_SYRINGE | INTRAVENOUS | Status: DC | PRN
  Administered 2019-11-15 (×2): 80 ug via INTRAVENOUS

## 2019-11-15 MED ORDER — SODIUM CHLORIDE 0.9 % IV SOLN: 250.0000 mL | INTRAVENOUS | Status: DC | PRN

## 2019-11-15 MED ORDER — DOBUTAMINE IN D5W 4-5 MG/ML-% IV SOLN
INTRAVENOUS | Status: DC | PRN
  Administered 2019-11-15: 20 ug/kg/min via INTRAVENOUS

## 2019-11-15 MED ORDER — HEPARIN SODIUM (PORCINE) 1000 UNIT/ML IJ SOLN
INTRAMUSCULAR | Status: DC | PRN
  Administered 2019-11-15: 1000 [IU] via INTRAVENOUS

## 2019-11-15 MED ORDER — MIDAZOLAM HCL 5 MG/5ML IJ SOLN
INTRAMUSCULAR | Status: DC | PRN
  Administered 2019-11-15: 2 mg via INTRAVENOUS

## 2019-11-15 MED ORDER — DEXAMETHASONE SODIUM PHOSPHATE 10 MG/ML IJ SOLN
INTRAMUSCULAR | Status: DC | PRN
  Administered 2019-11-15: 4 mg via INTRAVENOUS

## 2019-11-15 MED ORDER — ONDANSETRON HCL 4 MG/2ML IJ SOLN
INTRAMUSCULAR | Status: DC | PRN
  Administered 2019-11-15: 4 mg via INTRAVENOUS

## 2019-11-15 MED ORDER — HEPARIN (PORCINE) IN NACL 1000-0.9 UT/500ML-% IV SOLN
INTRAVENOUS | Status: DC | PRN
  Administered 2019-11-15 (×5): 500 mL

## 2019-11-15 MED ORDER — HEPARIN SODIUM (PORCINE) 1000 UNIT/ML IJ SOLN
INTRAMUSCULAR | Status: DC | PRN
  Administered 2019-11-15: 15000 [IU] via INTRAVENOUS
  Administered 2019-11-15: 3000 [IU] via INTRAVENOUS
  Administered 2019-11-15: 5000 [IU] via INTRAVENOUS

## 2019-11-15 MED ORDER — ROCURONIUM BROMIDE 10 MG/ML (PF) SYRINGE
PREFILLED_SYRINGE | INTRAVENOUS | Status: DC | PRN
  Administered 2019-11-15 (×2): 20 mg via INTRAVENOUS
  Administered 2019-11-15: 50 mg via INTRAVENOUS

## 2019-11-15 MED ORDER — SODIUM CHLORIDE 0.9 % IV SOLN: INTRAVENOUS | Status: DC

## 2019-11-15 SURGICAL SUPPLY — 20 items
BAG SNAP BAND KOVER 36X36 (MISCELLANEOUS) ×2 IMPLANT
BLANKET WARM UNDERBOD FULL ACC (MISCELLANEOUS) ×2 IMPLANT
CATH 8FR REPROCESSED SOUNDSTAR (CATHETERS) ×2 IMPLANT
CATH MAPPNG PENTARAY F 2-6-2MM (CATHETERS) ×1 IMPLANT
CATH S CIRCA THERM PROBE 10F (CATHETERS) ×2 IMPLANT
CATH SMTCH THERMOCOOL SF DF (CATHETERS) ×2 IMPLANT
CATH WEBSTER BI DIR CS D-F CRV (CATHETERS) ×2 IMPLANT
CLOSURE PERCLOSE PROSTYLE (VASCULAR PRODUCTS) ×8 IMPLANT
COVER SWIFTLINK CONNECTOR (BAG) ×4 IMPLANT
KIT VERSACROSS STEERABLE D1 (CATHETERS) ×2 IMPLANT
PACK EP LATEX FREE (CUSTOM PROCEDURE TRAY) ×2
PACK EP LF (CUSTOM PROCEDURE TRAY) ×1 IMPLANT
PAD PRO RADIOLUCENT 2001M-C (PAD) ×2 IMPLANT
PATCH CARTO3 (PAD) ×2 IMPLANT
PENTARAY F 2-6-2MM (CATHETERS) ×2
SHEATH CARTO VIZIGO SM CVD (SHEATH) ×2 IMPLANT
SHEATH PINNACLE 7F 10CM (SHEATH) ×2 IMPLANT
SHEATH PINNACLE 8F 10CM (SHEATH) ×4 IMPLANT
SHEATH PINNACLE 9F 10CM (SHEATH) ×2 IMPLANT
TUBING SMART ABLATE COOLFLOW (TUBING) ×2 IMPLANT

## 2019-11-15 NOTE — Anesthesia Procedure Notes (Signed)
Procedure Name: Intubation Date/Time: 11/15/2019 7:42 AM Performed by: Waynard Edwards, CRNA Pre-anesthesia Checklist: Patient identified, Emergency Drugs available, Suction available and Patient being monitored Patient Re-evaluated:Patient Re-evaluated prior to induction Oxygen Delivery Method: Circle system utilized Preoxygenation: Pre-oxygenation with 100% oxygen Induction Type: IV induction Ventilation: Mask ventilation without difficulty and Oral airway inserted - appropriate to patient size Laryngoscope Size: Hyacinth Meeker and 2 Grade View: Grade II Tube type: Oral Tube size: 7.5 mm Number of attempts: 1 Airway Equipment and Method: Stylet and Oral airway Placement Confirmation: ETT inserted through vocal cords under direct vision,  positive ETCO2 and breath sounds checked- equal and bilateral Secured at: 23 cm Tube secured with: Tape Dental Injury: Teeth and Oropharynx as per pre-operative assessment

## 2019-11-15 NOTE — Discharge Instructions (Signed)

## 2019-11-15 NOTE — Interval H&P Note (Signed)
History and Physical Interval Note:  11/15/2019 7:07 AM  Dylan Gordon  has presented today for surgery, with the diagnosis of afib.  The various methods of treatment have been discussed with the patient and family. After consideration of risks, benefits and other options for treatment, the patient has consented to  Procedure(s): ATRIAL FIBRILLATION ABLATION (N/A) as a surgical intervention.  The patient's history has been reviewed, patient examined, no change in status, stable for surgery.  I have reviewed the patient's chart and labs.  Questions were answered to the patient's satisfaction.     Andreea Arca [employer]

## 2019-11-15 NOTE — Transfer of Care (Signed)
Immediate Anesthesia Transfer of Care Note  Patient: Dylan Gordon  Procedure(s) Performed: ATRIAL FIBRILLATION ABLATION (N/A )  Patient Location: Cath Lab  Anesthesia Type:General  Level of Consciousness: drowsy  Airway & Oxygen Therapy: Patient Spontanous Breathing and Patient connected to face mask oxygen  Post-op Assessment: Report given to RN and Post -op Vital signs reviewed and stable  Post vital signs: Reviewed and stable  Last Vitals:  Vitals Value Taken Time  BP    Temp    Pulse    Resp    SpO2      Last Pain:  Vitals:   11/15/19 0538  TempSrc: Oral  PainSc: 0-No pain         Complications: No complications documented.

## 2019-11-15 NOTE — Anesthesia Preprocedure Evaluation (Signed)
Anesthesia Evaluation  Patient identified by MRN, date of birth, ID band Patient awake    Reviewed: Allergy & Precautions, NPO status , Patient's Chart, lab work & pertinent test results  Airway Mallampati: II  TM Distance: >3 FB Neck ROM: Full    Dental  (+) Teeth Intact, Dental Advisory Given   Pulmonary Current Smoker and Patient abstained from smoking.,    breath sounds clear to auscultation       Cardiovascular  Rhythm:Irregular Rate:Normal     Neuro/Psych    GI/Hepatic   Endo/Other    Renal/GU      Musculoskeletal   Abdominal   Peds  Hematology   Anesthesia Other Findings   Reproductive/Obstetrics                             Anesthesia Physical Anesthesia Plan  ASA: III  Anesthesia Plan: General   Post-op Pain Management:    Induction: Intravenous  PONV Risk Score and Plan: Ondansetron and Dexamethasone  Airway Management Planned: Oral ETT  Additional Equipment:   Intra-op Plan:   Post-operative Plan: Extubation in OR  Informed Consent: I have reviewed the patients History and Physical, chart, labs and discussed the procedure including the risks, benefits and alternatives for the proposed anesthesia with the patient or authorized representative who has indicated his/her understanding and acceptance.     Dental advisory given  Plan Discussed with: CRNA and Anesthesiologist  Anesthesia Plan Comments:         Anesthesia Quick Evaluation

## 2019-11-16 ENCOUNTER — Emergency Department (HOSPITAL_COMMUNITY): Payer: Medicare HMO

## 2019-11-16 ENCOUNTER — Other Ambulatory Visit: Payer: Self-pay

## 2019-11-16 ENCOUNTER — Observation Stay (HOSPITAL_COMMUNITY)
Admission: EM | Admit: 2019-11-16 | Discharge: 2019-11-17 | Disposition: A | Discharge status: Home / Self Care | Payer: Medicare HMO | Attending: Internal Medicine | Admitting: Internal Medicine

## 2019-11-16 DIAGNOSIS — Z8679 Personal history of other diseases of the circulatory system: Secondary | ICD-10-CM

## 2019-11-16 DIAGNOSIS — I4819 Other persistent atrial fibrillation: Secondary | ICD-10-CM

## 2019-11-16 DIAGNOSIS — Z79899 Other long term (current) drug therapy: Secondary | ICD-10-CM | POA: Diagnosis not present

## 2019-11-16 DIAGNOSIS — Z7901 Long term (current) use of anticoagulants: Secondary | ICD-10-CM | POA: Insufficient documentation

## 2019-11-16 DIAGNOSIS — R778 Other specified abnormalities of plasma proteins: Secondary | ICD-10-CM

## 2019-11-16 DIAGNOSIS — F17210 Nicotine dependence, cigarettes, uncomplicated: Secondary | ICD-10-CM | POA: Insufficient documentation

## 2019-11-16 DIAGNOSIS — I129 Hypertensive chronic kidney disease with stage 1 through stage 4 chronic kidney disease, or unspecified chronic kidney disease: Secondary | ICD-10-CM | POA: Insufficient documentation

## 2019-11-16 DIAGNOSIS — R319 Hematuria, unspecified: Secondary | ICD-10-CM | POA: Diagnosis not present

## 2019-11-16 DIAGNOSIS — R0602 Shortness of breath: Secondary | ICD-10-CM | POA: Diagnosis not present

## 2019-11-16 DIAGNOSIS — I319 Disease of pericardium, unspecified: Secondary | ICD-10-CM

## 2019-11-16 DIAGNOSIS — R072 Precordial pain: Secondary | ICD-10-CM | POA: Diagnosis present

## 2019-11-16 DIAGNOSIS — Z20822 Contact with and (suspected) exposure to covid-19: Secondary | ICD-10-CM | POA: Insufficient documentation

## 2019-11-16 DIAGNOSIS — N183 Chronic kidney disease, stage 3 unspecified: Secondary | ICD-10-CM | POA: Insufficient documentation

## 2019-11-16 DIAGNOSIS — R079 Chest pain, unspecified: Secondary | ICD-10-CM | POA: Diagnosis not present

## 2019-11-16 DIAGNOSIS — I4892 Unspecified atrial flutter: Secondary | ICD-10-CM | POA: Diagnosis not present

## 2019-11-16 DIAGNOSIS — R071 Chest pain on breathing: Secondary | ICD-10-CM | POA: Diagnosis not present

## 2019-11-16 LAB — CBC
HCT: 43.5 % (ref 39.0–52.0)
Hemoglobin: 13.9 g/dL (ref 13.0–17.0)
MCH: 30.6 pg (ref 26.0–34.0)
MCHC: 32 g/dL (ref 30.0–36.0)
MCV: 95.8 fL (ref 80.0–100.0)
Platelets: 185 10*3/uL (ref 150–400)
RBC: 4.54 MIL/uL (ref 4.22–5.81)
RDW: 13.6 % (ref 11.5–15.5)
WBC: 15.5 10*3/uL — ABNORMAL HIGH (ref 4.0–10.5)
nRBC: 0 % (ref 0.0–0.2)

## 2019-11-16 LAB — HEPATIC FUNCTION PANEL
ALT: 34 U/L (ref 0–44)
AST: 27 U/L (ref 15–41)
Albumin: 3.6 g/dL (ref 3.5–5.0)
Alkaline Phosphatase: 77 U/L (ref 38–126)
Bilirubin, Direct: 0.2 mg/dL (ref 0.0–0.2)
Indirect Bilirubin: 0.6 mg/dL (ref 0.3–0.9)
Total Bilirubin: 0.8 mg/dL (ref 0.3–1.2)
Total Protein: 6.6 g/dL (ref 6.5–8.1)

## 2019-11-16 LAB — RESP PANEL BY RT PCR (RSV, FLU A&B, COVID)
Influenza A by PCR: NEGATIVE
Influenza B by PCR: NEGATIVE
Respiratory Syncytial Virus by PCR: NEGATIVE
SARS Coronavirus 2 by RT PCR: NEGATIVE

## 2019-11-16 LAB — BRAIN NATRIURETIC PEPTIDE: B Natriuretic Peptide: 55.5 pg/mL (ref 0.0–100.0)

## 2019-11-16 LAB — BASIC METABOLIC PANEL
Anion gap: 9 (ref 5–15)
BUN: 13 mg/dL (ref 8–23)
CO2: 21 mmol/L — ABNORMAL LOW (ref 22–32)
Calcium: 9 mg/dL (ref 8.9–10.3)
Chloride: 107 mmol/L (ref 98–111)
Creatinine, Ser: 1.21 mg/dL (ref 0.61–1.24)
GFR, Estimated: >60 mL/min (ref >60)
Glucose, Bld: 118 mg/dL — ABNORMAL HIGH (ref 70–99)
Potassium: 4 mmol/L (ref 3.5–5.1)
Sodium: 137 mmol/L (ref 135–145)

## 2019-11-16 LAB — TROPONIN I (HIGH SENSITIVITY)
Troponin I (High Sensitivity): 1347 ng/L (ref <18)
Troponin I (High Sensitivity): 1192 ng/L (ref <18)

## 2019-11-16 LAB — LIPASE, BLOOD: Lipase: 34 U/L (ref 11–51)

## 2019-11-16 MED ORDER — ATORVASTATIN CALCIUM 10 MG PO TABS
20.0000 mg | ORAL_TABLET | Freq: Every day | ORAL | Status: DC
  Administered 2019-11-17: 20 mg via ORAL
  Filled 2019-11-16 (×2): qty 2

## 2019-11-16 MED ORDER — IOHEXOL 350 MG/ML SOLN
75.0000 mL | Freq: Once | INTRAVENOUS | Status: AC | PRN
  Administered 2019-11-16: 75 mL via INTRAVENOUS

## 2019-11-16 MED ORDER — METOPROLOL SUCCINATE ER 25 MG PO TB24
12.5000 mg | ORAL_TABLET | Freq: Every day | ORAL | Status: DC
  Administered 2019-11-17: 12.5 mg via ORAL
  Filled 2019-11-16 (×2): qty 1

## 2019-11-16 MED ORDER — APIXABAN 5 MG PO TABS
5.0000 mg | ORAL_TABLET | Freq: Two times a day (BID) | ORAL | Status: DC
  Administered 2019-11-16 – 2019-11-17 (×2): 5 mg via ORAL
  Filled 2019-11-16 (×2): qty 1

## 2019-11-16 MED ORDER — TAMSULOSIN HCL 0.4 MG PO CAPS
0.4000 mg | ORAL_CAPSULE | Freq: Every day | ORAL | Status: DC
  Administered 2019-11-16: 0.4 mg via ORAL
  Filled 2019-11-16: qty 1

## 2019-11-16 MED ORDER — ACETAMINOPHEN 325 MG PO TABS
650.0000 mg | ORAL_TABLET | ORAL | Status: DC | PRN
  Administered 2019-11-16 – 2019-11-17 (×2): 650 mg via ORAL
  Filled 2019-11-16 (×2): qty 2

## 2019-11-16 MED ORDER — FINASTERIDE 5 MG PO TABS
5.0000 mg | ORAL_TABLET | Freq: Every day | ORAL | Status: DC
  Administered 2019-11-16 – 2019-11-17 (×2): 5 mg via ORAL
  Filled 2019-11-16 (×2): qty 1

## 2019-11-16 NOTE — ED Provider Notes (Signed)
[facility] EMERGENCY DEPARTMENT Provider Note   CSN: [id number] Arrival date & time: 11/16/19  1418     History Chief Complaint  Patient presents with  . Chest Pain    Dylan Gordon is a 69 y.o. male.  The history is provided by the patient and medical records. No language interpreter was used.  Chest Pain Pain location:  Substernal area Pain quality: aching, pressure and sharp   Pain radiates to:  Does not radiate Pain severity:  Severe Onset quality:  Sudden Timing:  Constant Progression:  Unchanged Chronicity:  New Context: breathing   Relieved by:  Nothing Worsened by:  Nothing Ineffective treatments:  None tried Associated symptoms: shortness of breath   Associated symptoms: no back pain, no cough, no diaphoresis, no fatigue, no fever, no headache, no lower extremity edema, no nausea, no palpitations and no vomiting        Past Medical History:  Diagnosis Date  . Benign prostatic hyperplasia   . Calculus of kidney   . Chronic kidney disease, stage III (moderate) (HCC)   . Enlarged prostate with lower urinary tract symptoms (LUTS)   . Erectile dysfunction   . Hyperlipidemia     Patient Active Problem List   Diagnosis Date Noted  . Essential hypertension 03/02/2019  . Mixed hyperlipidemia 03/02/2019  . Atrial flutter (HCC) 10/29/2018    Past Surgical History:  Procedure Laterality Date  . ATRIAL FIBRILLATION ABLATION N/A 11/15/2019   Procedure: ATRIAL FIBRILLATION ABLATION;  Surgeon: Regan Lemming, MD;  Location: [facility] INVASIVE CV LAB;  Service: Cardiovascular;  Laterality: N/A;  . PROSTATE BIOPSY    . TONSILLECTOMY  1954       Family History  Problem Relation Age of Onset  . Asthma Mother   . Arrhythmia Mother   . Emphysema Father   . Colon cancer Maternal Aunt     Social History   Tobacco Use  . Smoking status: Current Every Day Smoker    Packs/day: 0.50    Years: 53.00    Pack years: 26.50    Types: Cigarettes    . Smokeless tobacco: Never Used  . Tobacco comment: 15 cigarettes per day  Vaping Use  . Vaping Use: Never used  Substance Use Topics  . Alcohol use: Not Currently  . Drug use: Never    Home Medications Prior to Admission medications   Medication Sig Start Date End Date Taking? Authorizing Provider  apixaban (ELIQUIS) 5 MG TABS tablet Take 1 tablet (5 mg total) by mouth 2 (two) times daily. 11/04/19   Tobb, Kardie, DO  atorvastatin (LIPITOR) 20 MG tablet Take 20 mg by mouth daily.    [provider]  calcium elemental as carbonate (TUMS ULTRA 1000) 400 MG chewable tablet Chew 1,000 mg by mouth daily as needed for heartburn.    [provider]  finasteride (PROSCAR) 5 MG tablet Take 5 mg by mouth daily. 09/27/18   [provider]  metoprolol succinate (TOPROL-XL) 25 MG 24 hr tablet TAKE 1/2 TABLET BY MOUTH EVERY DAY Patient taking differently: Take 12.5 mg by mouth daily.  10/21/19   Tobb, Kardie, DO  Multiple Vitamin (MULTIVITAMIN WITH MINERALS) TABS tablet Take 1 tablet by mouth once a week.     [provider]  tamsulosin (FLOMAX) 0.4 MG CAPS capsule Take 0.4 mg by mouth daily after supper.    [provider]    Allergies    Patient has no known allergies.  Review of Systems  Review of Systems  Constitutional: Negative for chills, diaphoresis, fatigue and fever.  HENT: Negative for congestion.   Eyes: Negative for visual disturbance.  Respiratory: Positive for chest tightness and shortness of breath. Negative for cough and wheezing.   Cardiovascular: Positive for chest pain. Negative for palpitations and leg swelling.  Gastrointestinal: Negative for constipation, diarrhea, nausea and vomiting.  Genitourinary: Negative for dysuria and flank pain.  Musculoskeletal: Negative for back pain.  Neurological: Negative for light-headedness and headaches.  Psychiatric/Behavioral: Negative for agitation and confusion.  All other systems  reviewed and are negative.   Physical Exam Updated Vital Signs BP 118/76 (BP Location: Left Arm)   Pulse 80   Temp 98.2 F (36.8 C) (Oral)   Resp 18   Ht 6\' 2"  (1.88 m)   Wt 101.2 kg   SpO2 98%   BMI 28.63 kg/m   Physical Exam Vitals and nursing note reviewed.  Constitutional:      General: He is not in acute distress.    Appearance: He is well-developed. He is not ill-appearing, toxic-appearing or diaphoretic.  HENT:     Head: Normocephalic and atraumatic.  Eyes:     Extraocular Movements: Extraocular movements intact.     Conjunctiva/sclera: Conjunctivae normal.     Pupils: Pupils are equal, round, and reactive to light.  Cardiovascular:     Rate and Rhythm: Normal rate and regular rhythm.     Heart sounds: Normal heart sounds. No murmur heard.   Pulmonary:     Effort: Pulmonary effort is normal. No tachypnea or respiratory distress.     Breath sounds: Normal breath sounds. No decreased breath sounds, wheezing, rhonchi or rales.  Abdominal:     Palpations: Abdomen is soft.     Tenderness: There is no abdominal tenderness.  Musculoskeletal:     Cervical back: Neck supple.     Right lower leg: No tenderness. No edema.     Left lower leg: No tenderness. No edema.  Skin:    General: Skin is warm and dry.     Capillary Refill: Capillary refill takes less than 2 seconds.     Findings: No ecchymosis, erythema or rash.     Nails: There is no clubbing.  Neurological:     General: No focal deficit present.     Mental Status: He is alert.  Psychiatric:        Mood and Affect: Mood normal.     ED Results / Procedures / Treatments   Labs (all labs ordered are listed, but only abnormal results are displayed) Labs Reviewed  BASIC METABOLIC PANEL - Abnormal; Notable for the following components:      Result Value   CO2 21 (*)    Glucose, Bld 118 (*)    All other components within normal limits  CBC - Abnormal; Notable for the following components:   WBC 15.5 (*)     All other components within normal limits  TROPONIN I (HIGH SENSITIVITY) - Abnormal; Notable for the following components:   Troponin I (High Sensitivity) 1,347 (*)    All other components within normal limits  TROPONIN I (HIGH SENSITIVITY) - Abnormal; Notable for the following components:   Troponin I (High Sensitivity) 1,192 (*)    All other components within normal limits  RESP PANEL BY RT PCR (RSV, FLU A&B, COVID)  BRAIN NATRIURETIC PEPTIDE  HEPATIC FUNCTION PANEL  LIPASE, BLOOD  BASIC METABOLIC PANEL  CBC    EKG EKG Interpretation  Date/Time:  Saturday November 16 2019 17:42:12 EDT Ventricular Rate:  80 PR Interval:    QRS Duration: 93 QT Interval:  341 QTC Calculation: 394 R Axis:   52 Text Interpretation: Sinus rhythm Prolonged PR interval Abnormal R-wave progression, early transition when compared to prior, similar apperance. No STEMI Confirmed by Theda Belfast ([id number]) on 11/16/2019 5:45:45 PM   Radiology DG Chest 2 View  Result Date: 11/16/2019 CLINICAL DATA:  Chest pain EXAM: CHEST - 2 VIEW COMPARISON:  None. FINDINGS: The heart size and mediastinal contours are within normal limits. Both lungs are clear. The visualized skeletal structures are unremarkable. IMPRESSION: No active cardiopulmonary disease. Electronically Signed   By: Jonna Clark M.D.   On: 11/16/2019 15:13   CT Angio Chest PE W and/or Wo Contrast  Result Date: 11/16/2019 CLINICAL DATA:  Shortness of breath and chest pain status post ablation EXAM: CT ANGIOGRAPHY CHEST WITH CONTRAST TECHNIQUE: Multidetector CT imaging of the chest was performed using the standard protocol during bolus administration of intravenous contrast. Multiplanar CT image reconstructions and MIPs were obtained to evaluate the vascular anatomy. CONTRAST:  75mL OMNIPAQUE IOHEXOL 350 MG/ML SOLN COMPARISON:  None. FINDINGS: Cardiovascular: There is a optimal opacification of the pulmonary arteries. There is no central,segmental, or  subsegmental filling defects within the pulmonary arteries. There is mild cardiomegaly. Trace pericardial effusion is noted. No evidence right heart strain. There is normal three-vessel brachiocephalic anatomy without proximal stenosis. Scattered aortic atherosclerosis is noted. Mediastinum/Nodes: No hilar, mediastinal, or axillary adenopathy. Thyroid gland, trachea, and esophagus demonstrate no significant findings. Lungs/Pleura: Paraseptal emphysematous changes and bullae are noted. No pleural effusion or pneumothorax. No airspace consolidation. Upper Abdomen: No acute abnormalities present in the visualized portions of the upper abdomen. Musculoskeletal: No chest wall abnormality. No acute or significant osseous findings. Review of the MIP images confirms the above findings. IMPRESSION: No central, segmental, or subsegmental pulmonary embolism. Mild cardiomegaly and trace pericardial effusion. No other acute intrathoracic pathology to explain the patient's symptoms. Emphysema (ICD10-J43.9).  And Aortic Atherosclerosis (ICD10-I70.0). Electronically Signed   By: Jonna Clark M.D.   On: 11/16/2019 17:44   EP STUDY  Result Date: 11/15/2019 SURGEON:  Loman Brooklyn, MD PREPROCEDURE DIAGNOSES: 1. Persistent atrial fibrillation. POSTPROCEDURE DIAGNOSES: 1. Persistent atrial fibrillation. PROCEDURES: 1. Comprehensive electrophysiologic study. 2. Coronary sinus pacing and recording. 3. Three-dimensional mapping of atrial fibrillation (with additional mapping and ablation within the left atrium due to persistence of afib) 4. Ablation of atrial fibrillation (with additional mapping and ablation within the left atrium due to persistence of afib) 5. Intracardiac echocardiography. 6. Transseptal puncture of an intact septum. 7. Arrhythmia induction with pacing 8. External cardioversion. INTRODUCTION:  Dylan Gordon is a 69 y.o. male with a history of persistent atrial fibrillation who now presents for EP study and  radiofrequency ablation.  The patient reports initially being diagnosed with atrial fibrillation after presenting with symptomatic palpitations and fatgiue.  The patient has failed medical therapy.  The patient therefore presents today for catheter ablation of atrial fibrillation. DESCRIPTION OF PROCEDURE:  Informed written consent was obtained, and the patient was brought to the electrophysiology lab in a fasting state.  The patient was adequately sedated with intravenous medications as outlined in the anesthesia report.  The patient's left and right groins were prepped and draped in the usual sterile fashion by the EP lab staff.  Using a percutaneous Seldinger technique, two 7-French and one 11-French hemostasis sheaths were placed into the right common femoral vein.  An esophageal temperature probe was inserted  to monitor for heating of the esophagus during the procedure. Direct ultrasound guidance is used for right and left femoral veins with normal vessel patency. Ultrasound images are captured and stored in the patient's chart. Using ultrasound guidance, the Brockenbrough needle and wire were visualized entering the vessel. Catheter Placement:  A 7-French Biosense Webster Decapolar coronary sinus catheter was introduced through the right common femoral vein and advanced into the coronary sinus for recording and pacing from this location.    Initial Measurements: The patient presented to the electrophysiology lab in atrial fibrillation.   The average RR interval measured 679 msec.   Intracardiac Echocardiography: A 10-French Biosense Webster AcuNav intracardiac echocardiography catheter was introduced through the right common femoral vein and advanced into the right atrium. Intracardiac echocardiography was performed of the left atrium, and a three-dimensional anatomical rendering of the left atrium was performed using CARTO sound technology.  The patient was noted to have a moderate sized left atrium.  The  interatrial septum was prominent but not aneurysmal. All 4 pulmonary veins were visualized and noted to have separate ostia.  The pulmonary veins were moderate in size.  The left atrial appendage was visualized and did not reveal thrombus.   There was no evidence of pulmonary vein stenosis. Transseptal Puncture: The right common femoral vein sheaths were was exchanged for an 8.5 JamaicaFrench Agillis transseptal sheath and transseptal access was achieved in a standard fashion using a Brockenbrough needle under biplane fluoroscopy with intracardiac echocardiography confirmation of the transseptal puncture.  Once transseptal access had been achieved, heparin was administered intravenously and intra- arterially in order to maintain an ACT of greater than 350 seconds throughout the procedure.  3D Mapping and Ablation: A 3.5 mm Biosense [employer] Corporation[employer] Smart Touch Thermocool ablation catheter was advanced into the right atrium.  The transseptal sheath was pulled back into the IVC over a guidewire.  The ablation catheter was advanced across the transseptal hole using the wire as a guide.  The transseptal sheath was then re-advanced over the guidewire into the left atrium.  A duodecapolar Biosense Webster circular mapping catheter was introduced through the transseptal sheath and positioned over the mouth of all 4 pulmonary veins.  Three-dimensional electroanatomical mapping was performed using CARTO technology.  This demonstrated electrical activity within all four pulmonary veins at baseline. The patient underwent successful sequential electrical isolation and anatomical encircling of all four pulmonary veins using radiofrequency current with a circular mapping catheter as a guide. A WACA approach was used. Due to persistence of atrial fibrillation, additional left atrial mapping and ablation was performed.  A series of radiofrequency lesions were delivered along the roof and floor of the left atrium in order to create a "standard box"  lesion along the posterior wall of the left atrium. The ablation catheter was then pulled back into the right atrial and positioned along the cavo-tricuspid isthmus.  3D Mapping along the atrial side of the isthmus was performed.  This demonstrated a standard isthmus.  A series of radiofrequency applications were then delivered along the isthmus at 30-40 Watts with a target index of 450-500.   Complete bidirectional cavotricuspid isthmus block was achieved as confirmed by differential atrial pacing from the low lateral right atrium.  A stimulus to earliest atrial activation across the isthmus measured 170 msec bi-directionally.  The patient was observe without return of conduction through the isthmus. Cardioversion: The patient was then cardioverted to sinus rhythm with a single synchronized 250-J biphasic shock with cardioversion electrodes in the anterior-posterior  thoracic configuration.   he remained in sinus rhythm thereafter. Measurements Following Ablation: In sinus rhythm the RR interval was , with PR 115 msec, QRS 90 msec, and QT 410 msec.  Following ablation the AH interval measured 137 msec with an HV interval of 61 msec. Ventricular pacing was performed, which revealed midline decremental VA conduction with a VA Wenckebach cycle length of 600 msec. Rapid atrial pacing was performed, which revealed an AV Wenckebach cycle length of 560 msec.  Electroisolation was then again confirmed in all four pulmonary veins. The procedure was therefore considered completed.  All catheters were removed, and the sheaths were aspirated and flushed.  The patient was transferred to the recovery area for sheath removal per protocol.  Intracardiac echocardiogram revealed no pericardial effusion. EBL<97ml.  There were no early apparent complications. CONCLUSIONS: 1. Atrial fibrillation upon presentation.  2. Successful electrical isolation and anatomical encircling of all four pulmonary veins with radiofrequency  current.  A WACA approach was used 3. Additional left atrial ablation was performed with a standard box lesion created along the posterior wall of the left atrium 4. Atrial fibrillation successfully cardioverted to sinus rhythm. 5. No early apparent complications. Will Dylan Deutscher Camnitz,MD 9:55 AM 11/15/2019    Procedures Procedures (including critical care time)  Medications Ordered in ED Medications  atorvastatin (LIPITOR) tablet 20 mg (has no administration in time range)  metoprolol succinate (TOPROL-XL) 24 hr tablet 12.5 mg (has no administration in time range)  finasteride (PROSCAR) tablet 5 mg (5 mg Oral Given 11/16/19 2118)  tamsulosin (FLOMAX) capsule 0.4 mg (0.4 mg Oral Given 11/16/19 2119)  apixaban (ELIQUIS) tablet 5 mg (5 mg Oral Given 11/16/19 2119)  acetaminophen (TYLENOL) tablet 650 mg (650 mg Oral Given 11/16/19 2131)  iohexol (OMNIPAQUE) 350 MG/ML injection 75 mL (75 mLs Intravenous Contrast Given 11/16/19 1656)    ED Course  I have reviewed the triage vital signs and the nursing notes.  Pertinent labs & imaging results that were available during my care of the patient were reviewed by me and considered in my medical decision making (see chart for details).    MDM Rules/Calculators/A&P                          Johnedward Brodrick is a 69 y.o. male with a past medical history significant for hypertension, hyperlipidemia, atrial flutter status post ablation procedure yesterday who presents with several hours of pleuritic significant chest pain.  He reports that he was discharged yesterday and stayed with family overnight.  He reports he went back home today and around 11 AM, had sudden onset of sharp and tight chest discomfort in his central chest.  He reports he cannot lie flat and cannot take a deep breath.  He reports his pain is new.  He denies palpitations lightheadedness or syncope.  He reports the pain was a 9 out of 10 and is slowly starting to improve.  He reports is extremely  pruritic.  It is not exertional.  He denies any nausea, vomiting, diaphoresis.       On exam, lungs are clear and chest is nontender, I cannot reproduce his discomfort.  No murmur.  Abdomen nontender.  Legs nontender.  Good pulses in extremities.  Vital signs reassuring on arrival.  EKG shows now sinus rhythm with no STEMI.  Clinically I do feel need to rule out a pulmonary embolism given his procedure yesterday and new extremely pleuritic chest discomfort.  We will also  get labs and x-ray.  Anticipate touch with cardiology given the new chest discomfort after having a procedure yesterday.  This could all be postprocedural pain.  Anticipate reassessment after work-up.  Initial troponin returned at 1347.  It was downtrending on repeat.  I suspect this may relate to his ablation yesterday.  I called cardiology who will come see and admit the patient.  Patient will be admitted for further monitoring and management of his chest pain following his cardiac procedure yesterday.  Final Clinical Impression(s) / ED Diagnoses Final diagnoses:  Chest pain on breathing     Clinical Impression: 1. Chest pain on breathing     Disposition: Admit  This note was prepared with assistance of Dragon voice recognition software. Occasional wrong-word or sound-a-like substitutions may have occurred due to the inherent limitations of voice recognition software.      Allister Lessley, Canary Brim, MD 11/16/19 [phone]

## 2019-11-16 NOTE — H&P (Signed)
Cardiology Admission History and Physical:   Patient ID: Dylan Gordon MRN: [id number]; DOB: 06/08/1950   Admission date: 11/16/2019  Primary Care Provider: Paulina FusiSchultz, Douglas E, MD Primary Cardiologist: Thomasene RippleKardie Tobb, DO  Primary Electrophysiologist:  Will Jorja LoaMartin Camnitz, MD   Chief Complaint:  Chest pain and shortness of breath   Patient Profile:   Dylan Gordon is a 69 y.o. male with atrial flutter, hyperlipidemia who went for atrial flutter ablation yesterday now with chest pain.   History of Present Illness:   Dylan Gordon is a 69 y.o. male with atrial flutter, hyperlipidemia who went for atrial flutter ablation yesterday. The procedure was successful.  He has been compliant with his medications and has not missed any doses of his anticoagulation since hospitalization.  At 11 PM yesterday he began having chest pain that was pleuritic in nature.  His pain was worsened on deep inhalation.  He began to have some shortness of breath and chest congestion.   To come to the emergency department for evaluation.  On arrival his laboratory work-up was largely unremarkable.  His vitals were stable.  EKG had nonspecific changes.  However, his high-sensitivity troponin was 1000 and downtrending.  He is still currently having chest discomfort that is worse with deep inhalation.  Positional and does not radiate.  Not having any other notable symptoms.   Past Medical History:  Diagnosis Date  . Benign prostatic hyperplasia   . Calculus of kidney   . Chronic kidney disease, stage III (moderate) (HCC)   . Enlarged prostate with lower urinary tract symptoms (LUTS)   . Erectile dysfunction   . Hyperlipidemia     Past Surgical History:  Procedure Laterality Date  . ATRIAL FIBRILLATION ABLATION N/A 11/15/2019   Procedure: ATRIAL FIBRILLATION ABLATION;  Surgeon: Regan Lemmingamnitz, Will Martin, MD;  Location: [facility];  Service: Cardiovascular;  Laterality: N/A;  . PROSTATE BIOPSY    .  TONSILLECTOMY  1954     Medications Prior to Admission: Prior to Admission medications   Medication Sig Start Date End Date Taking? Authorizing Provider  apixaban (ELIQUIS) 5 MG TABS tablet Take 1 tablet (5 mg total) by mouth 2 (two) times daily. 11/04/19  Yes Tobb, Kardie, DO  atorvastatin (LIPITOR) 20 MG tablet Take 20 mg by mouth daily.   Yes [provider]  calcium elemental as carbonate (TUMS ULTRA 1000) 400 MG chewable tablet Chew 1,000 mg by mouth daily as needed for heartburn.   Yes [provider]  finasteride (PROSCAR) 5 MG tablet Take 5 mg by mouth daily. 09/27/18  Yes [provider]  metoprolol succinate (TOPROL-XL) 25 MG 24 hr tablet TAKE 1/2 TABLET BY MOUTH EVERY DAY Patient taking differently: Take 12.5 mg by mouth daily.  10/21/19  Yes Tobb, Kardie, DO  Multiple Vitamin (MULTIVITAMIN WITH MINERALS) TABS tablet Take 1 tablet by mouth once a week.    Yes [provider]  tamsulosin (FLOMAX) 0.4 MG CAPS capsule Take 0.4 mg by mouth daily after supper.   Yes [provider]     Allergies:   No Known Allergies  Social History:   Social History   Socioeconomic History  . Marital status: Divorced    Spouse name: Not on file  . Number of children: Not on file  . Years of education: Not on file  . Highest education level: Not on file  Occupational History  . Not on file  Tobacco Use  . Smoking status: Current Every Day Smoker  Packs/day: 0.50    Years: 53.00    Pack years: 26.50    Types: Cigarettes  . Smokeless tobacco: Never Used  . Tobacco comment: 15 cigarettes per day  Vaping Use  . Vaping Use: Never used  Substance and Sexual Activity  . Alcohol use: Not Currently  . Drug use: Never  . Sexual activity: Not on file  Other Topics Concern  . Not on file  Social History Narrative  . Not on file   Social Determinants of Health   Financial Resource Strain:   . Difficulty of Paying Living Expenses: Not on file    Food Insecurity:   . Worried About Programme researcher, broadcasting/film/video in the Last Year: Not on file  . Ran Out of Food in the Last Year: Not on file  Transportation Needs:   . Lack of Transportation (Medical): Not on file  . Lack of Transportation (Non-Medical): Not on file  Physical Activity:   . Days of Exercise per Week: Not on file  . Minutes of Exercise per Session: Not on file  Stress:   . Feeling of Stress : Not on file  Social Connections:   . Frequency of Communication with Friends and Family: Not on file  . Frequency of Social Gatherings with Friends and Family: Not on file  . Attends Religious Services: Not on file  . Active Member of Clubs or Organizations: Not on file  . Attends Banker Meetings: Not on file  . Marital Status: Not on file  Intimate Partner Violence:   . Fear of Current or Ex-Partner: Not on file  . Emotionally Abused: Not on file  . Physically Abused: Not on file  . Sexually Abused: Not on file    Family History:   The patient's family history includes Arrhythmia in his mother; Asthma in his mother; Colon cancer in his maternal aunt; Emphysema in his father.    Review of Systems: [y] = yes, [ ]  = no     General: Weight gain [ ] ; Weight loss [ ] ; Anorexia [ ] ; Fatigue [ ] ; Fever [ ] ; Chills [ ] ; Weakness [ ]    Cardiac: Chest pain/pressure [ y]; Resting SOB [ ] ; Exertional SOB [ ] ; Orthopnea [ ] ; Pedal Edema [ ] ; Palpitations [ ] ; Syncope [ ] ; Presyncope [ ] ; Paroxysmal nocturnal dyspnea[ ]    Pulmonary: Cough [ ] ; Wheezing[ ] ; Hemoptysis[ ] ; Sputum [ ] ; Snoring [ ]    GI: Vomiting[ ] ; Dysphagia[ ] ; Melena[ ] ; Hematochezia [ ] ; Heartburn[ ] ; Abdominal pain [ ] ; Constipation [ ] ; Diarrhea [ ] ; BRBPR [ ]    GU: Hematuria[ ] ; Dysuria [ ] ; Nocturia[ ]    Vascular: Pain in legs with walking [ ] ; Pain in feet with lying flat [ ] ; Non-healing sores [ ] ; Stroke [ ] ; TIA [ ] ; Slurred speech [ ] ;   Neuro: Headaches[ ] ; Vertigo[ ] ; Seizures[ ] ; Paresthesias[  ];Blurred vision [ ] ; Diplopia [ ] ; Vision changes [ ]    Ortho/Skin: Arthritis [ ] ; Joint pain [ ] ; Muscle pain [ ] ; Joint swelling [ ] ; Back Pain [ ] ; Rash [ ]    Psych: Depression[ ] ; Anxiety[ ]    Heme: Bleeding problems [ ] ; Clotting disorders [ ] ; Anemia [ ]    Endocrine: Diabetes [ ] ; Thyroid dysfunction[ ]   Physical Exam/Data:   Vitals:   11/16/19 1830 11/16/19 1845 11/16/19 1900 11/16/19 1915  BP: 109/86 128/70 127/73 123/71  Pulse: 82 79 80 78  Resp: (!) 28 (!) 26 (!)  26 (!) 27  Temp:      TempSrc:      SpO2: 97% 96% 96% 95%  Weight:      Height:       No intake or output data in the 24 hours ending 11/16/19 2041 Filed Weights   11/16/19 1429 11/16/19 1616  Weight: 101.2 kg 101.2 kg   Body mass index is 28.63 kg/m.  General:  No apparent distress HEENT: normal Lymph: no adenopathy Neck: JVP is flat  Endocrine:  No thryomegaly Vascular: No carotid bruits; FA pulses 2+ bilaterally without bruits  Cardiac:  normal S1, S2; RRR; no murmur, no rubs  Lungs:  clear to auscultation bilaterally, no wheezing, rhonchi or rales  Abd: soft, nontender, no hepatomegaly  Ext: no edema Musculoskeletal:  No deformities, BUE and BLE strength normal and equal Skin: warm and dry  Neuro:  CNs 2-12 intact, no focal abnormalities noted Psych:  Normal affect    EKG:  The ECG that was done in the emergency department was personally reviewed and demonstrates normal sinus rhythm with nonspecific T wave changes   Relevant CV Studies: EP aplation 11/15/19  Laboratory Data:  Chemistry Recent Labs  Lab 11/16/19 1511  NA 137  K 4.0  CL 107  CO2 21*  GLUCOSE 118*  BUN 13  CREATININE 1.21  CALCIUM 9.0  GFRNONAA >60  ANIONGAP 9    Recent Labs  Lab 11/16/19 1618  PROT 6.6  ALBUMIN 3.6  AST 27  ALT 34  ALKPHOS 77  BILITOT 0.8   Hematology Recent Labs  Lab 11/16/19 1511  WBC 15.5*  RBC 4.54  HGB 13.9  HCT 43.5  MCV 95.8  MCH 30.6  MCHC 32.0  RDW 13.6  PLT 185    Cardiac EnzymesNo results for input(s): TROPONINI in the last 168 hours. No results for input(s): TROPIPOC in the last 168 hours.  BNP Recent Labs  Lab 11/16/19 1618  BNP 55.5    DDimer No results for input(s): DDIMER in the last 168 hours.  Radiology/Studies:  DG Chest 2 View  Result Date: 11/16/2019 CLINICAL DATA:  Chest pain EXAM: CHEST - 2 VIEW COMPARISON:  None. FINDINGS: The heart size and mediastinal contours are within normal limits. Both lungs are clear. The visualized skeletal structures are unremarkable. IMPRESSION: No active cardiopulmonary disease. Electronically Signed   By: Jonna Clark M.D.   On: 11/16/2019 15:13   CT Angio Chest PE W and/or Wo Contrast  Result Date: 11/16/2019 CLINICAL DATA:  Shortness of breath and chest pain status post ablation EXAM: CT ANGIOGRAPHY CHEST WITH CONTRAST TECHNIQUE: Multidetector CT imaging of the chest was performed using the standard protocol during bolus administration of intravenous contrast. Multiplanar CT image reconstructions and MIPs were obtained to evaluate the vascular anatomy. CONTRAST:  76mL OMNIPAQUE IOHEXOL 350 MG/ML SOLN COMPARISON:  None. FINDINGS: Cardiovascular: There is a optimal opacification of the pulmonary arteries. There is no central,segmental, or subsegmental filling defects within the pulmonary arteries. There is mild cardiomegaly. Trace pericardial effusion is noted. No evidence right heart strain. There is normal three-vessel brachiocephalic anatomy without proximal stenosis. Scattered aortic atherosclerosis is noted. Mediastinum/Nodes: No hilar, mediastinal, or axillary adenopathy. Thyroid gland, trachea, and esophagus demonstrate no significant findings. Lungs/Pleura: Paraseptal emphysematous changes and bullae are noted. No pleural effusion or pneumothorax. No airspace consolidation. Upper Abdomen: No acute abnormalities present in the visualized portions of the upper abdomen. Musculoskeletal: No chest wall  abnormality. No acute or significant osseous findings. Review of the  MIP images confirms the above findings. IMPRESSION: No central, segmental, or subsegmental pulmonary embolism. Mild cardiomegaly and trace pericardial effusion. No other acute intrathoracic pathology to explain the patient's symptoms. Emphysema (ICD10-J43.9).  And Aortic Atherosclerosis (ICD10-I70.0). Electronically Signed   By: Jonna Clark M.D.   On: 11/16/2019 17:44    Assessment and Plan:   1. Pleuritic Chest Pain.  Acute onset of pleuritic chest pain that started yesterday at 11 PM.  We believe this is secondary to his procedure done yesterday [elevated troponin given recent ablation.  We will continue him on his home medications and as needed pain medication.  1 telemetry overnight as observation admission.  No need for further trending troponin with his chest pain worsens.  He does have a mild leukocytosis with some chest congestion and a cough.  Will order respiratory viral panel and a COVID-19 test.  Unlikely that this is infectious, however 1-6 seizures.  Chest x-ray appears clear. 2. Atrial Flutter. Continue apixaban and metoprolol.  3. BPH> continue finasteride and tamsulosin.   Severity of Illness: The appropriate patient status for this patient is OBSERVATION. Observation status is judged to be reasonable and necessary in order to provide the required intensity of service to ensure the patient's safety. The patient's presenting symptoms, physical exam findings, and initial radiographic and laboratory data in the context of their medical condition is felt to place them at decreased risk for further clinical deterioration. Furthermore, it is anticipated that the patient will be medically stable for discharge from the hospital within 2 midnights of admission. The following factors support the patient status of observation.   " The patient's presenting symptoms include chest pain. " The physical exam findings include  pleuritic chest pain. " The initial radiographic and laboratory data are elevated troponin (after ablation yesterday) .     For questions or updates, please contact [facility] Please consult [email or web address] for contact info under        Signed, Joellen Jersey, MD  11/16/2019 8:41 PM

## 2019-11-16 NOTE — ED Notes (Signed)
Date and time results received: 11/16/19 1604 (use smartphrase ".now" to insert current time)  Test: troponin Critical Value: 1347  Name of Provider Notified: Dr. Rush Landmark  Orders Received? Or Actions Taken?: Orders Received - See Orders for details and Actions Taken: EKG

## 2019-11-16 NOTE — ED Triage Notes (Signed)
EMS stated, I had an ablation yesterday here, this morning had some chest pain with SOB  This morning.

## 2019-11-16 NOTE — ED Triage Notes (Signed)
20g IV Left AC and given 324mg  ASA.

## 2019-11-17 DIAGNOSIS — R319 Hematuria, unspecified: Secondary | ICD-10-CM

## 2019-11-17 DIAGNOSIS — R778 Other specified abnormalities of plasma proteins: Secondary | ICD-10-CM

## 2019-11-17 DIAGNOSIS — I319 Disease of pericardium, unspecified: Secondary | ICD-10-CM

## 2019-11-17 DIAGNOSIS — I4892 Unspecified atrial flutter: Secondary | ICD-10-CM

## 2019-11-17 DIAGNOSIS — R071 Chest pain on breathing: Principal | ICD-10-CM

## 2019-11-17 DIAGNOSIS — Z9889 Other specified postprocedural states: Secondary | ICD-10-CM

## 2019-11-17 DIAGNOSIS — Z8679 Personal history of other diseases of the circulatory system: Secondary | ICD-10-CM | POA: Diagnosis not present

## 2019-11-17 LAB — BASIC METABOLIC PANEL
Anion gap: 7 (ref 5–15)
BUN: 12 mg/dL (ref 8–23)
CO2: 22 mmol/L (ref 22–32)
Calcium: 8.3 mg/dL — ABNORMAL LOW (ref 8.9–10.3)
Chloride: 106 mmol/L (ref 98–111)
Creatinine, Ser: 1.08 mg/dL (ref 0.61–1.24)
GFR, Estimated: >60 mL/min (ref >60)
Glucose, Bld: 105 mg/dL — ABNORMAL HIGH (ref 70–99)
Potassium: 3.9 mmol/L (ref 3.5–5.1)
Sodium: 135 mmol/L (ref 135–145)

## 2019-11-17 LAB — CBC
HCT: 35.6 % — ABNORMAL LOW (ref 39.0–52.0)
Hemoglobin: 11.6 g/dL — ABNORMAL LOW (ref 13.0–17.0)
MCH: 30.5 pg (ref 26.0–34.0)
MCHC: 32.6 g/dL (ref 30.0–36.0)
MCV: 93.7 fL (ref 80.0–100.0)
Platelets: 153 10*3/uL (ref 150–400)
RBC: 3.8 MIL/uL — ABNORMAL LOW (ref 4.22–5.81)
RDW: 13.6 % (ref 11.5–15.5)
WBC: 11.9 10*3/uL — ABNORMAL HIGH (ref 4.0–10.5)
nRBC: 0 % (ref 0.0–0.2)

## 2019-11-17 LAB — URINALYSIS, ROUTINE W REFLEX MICROSCOPIC
Bilirubin Urine: NEGATIVE
Glucose, UA: NEGATIVE mg/dL
Hgb urine dipstick: NEGATIVE
Ketones, ur: NEGATIVE mg/dL
Leukocytes,Ua: NEGATIVE
Nitrite: NEGATIVE
Protein, ur: NEGATIVE mg/dL
Specific Gravity, Urine: 1.026 (ref 1.005–1.030)
pH: 7 (ref 5.0–8.0)

## 2019-11-17 NOTE — ED Notes (Signed)
Family at bedside. 

## 2019-11-17 NOTE — Discharge Summary (Addendum)
Discharge Summary    Patient ID: Dylan Gordon MRN: [id number]; DOB: 05/03/1950  Admit date: 11/16/2019 Discharge date: 11/17/2019  Primary Care Provider: Nicoletta Dress, MD  Primary Cardiologist: Berniece Salines, DO  Primary Electrophysiologist:  Constance Haw, MD   Discharge Diagnoses    Principal Problem:   Chest pain Active Problems:   Atrial flutter, paroxysmal ([place])   S/P ablation of atrial flutter   Elevated troponin   Pericarditis   Hematuria   Diagnostic Studies/Procedures    Prior Afib ablation 11/15/19 SURGEON:  Allegra Lai, MD  PREPROCEDURE DIAGNOSES: 1. Persistent atrial fibrillation.  POSTPROCEDURE DIAGNOSES: 1. Persistent atrial fibrillation.  PROCEDURES: 1. Comprehensive electrophysiologic study. 2. Coronary sinus pacing and recording. 3. Three-dimensional mapping of atrial fibrillation (with additional mapping and ablation within the left atrium due to persistence of afib) 4. Ablation of atrial fibrillation (with additional mapping and ablation within the left atrium due to persistence of afib) 5. Intracardiac echocardiography. 6. Transseptal puncture of an intact septum. 7. Arrhythmia induction with pacing 8. External cardioversion.  CONCLUSIONS: 1. Atrial fibrillation upon presentation.   2. Successful electrical isolation and anatomical encircling of all four pulmonary veins with radiofrequency current.  A WACA approach was used 3. Additional left atrial ablation was performed with a standard box lesion created along the posterior wall of the left atrium 4. Atrial fibrillation successfully cardioverted to sinus rhythm. 5. No early apparent complications.   Will Hassell Done Camnitz,MD 9:55 AM 11/15/2019  _____________   History of Present Illness     Dylan Gordon is a 69 y.o. male with pmh CKD stage 3, BPH, HLa, and aflutter s/p recent ablation who was admitted for chest pain. The patient underwent aflutter ablation 11/15/19. This  was successful and he was sent home in stable condition. And then on 11/16/19 he presented to the ED for chest pain.  Chest pain was pleuritic in nature. He also started experiencing shortness of breath and chest congestion. He denied missing doses of anticoagulation.  Hospital Course     Consultants: None  In the ED labs showed mild leukocytosis. Vitals were stable. EKG showed NSR with borderline diffuse ST elevation. HS troponin went up to 1000. He reported chest pain with deep inhalation. He was admitted overnight for observation. His home medications including Apixaban were continued. It was suspected pain was secondary to afib ablation the day before. HS troponin down trended. CXR was clear and respiratory panel was negative. CTA chest showed no PE, mild cardiomegaly, and no other acute issue. The next day patient reported new mild hematuria however chest pain had improved. Follow-up EKG also showed diffuse ST elevation suggestive of pericarditis. In the setting of hematuria plan to avoid NSAIDS. UA with cultures were sent. Will continue all home medications at discharge.  Patient has follow-up.   The patient was evaluated by Dr. Meda Coffee on 11/17/19 and felt to be stable for discharge.  Body mass index is 28.63 kg/m.  General:  No apparent distress HEENT: normal Lymph: no adenopathy Neck: JVP is flat  Endocrine:  No thryomegaly Vascular: No carotid bruits; FA pulses 2+ bilaterally without bruits  Cardiac:  normal S1, S2; RRR; no murmur, no rubs  Lungs:  clear to auscultation bilaterally, no wheezing, rhonchi or rales  Abd: soft, nontender, no hepatomegaly  Ext: no edema Musculoskeletal:  No deformities, BUE and BLE strength normal and equal Skin: warm and dry  Neuro:  CNs 2-12 intact, no focal abnormalities noted Psych:  Normal affect    Did  the patient have an acute coronary syndrome (MI, NSTEMI, STEMI, etc) this admission?:  No                               Did the patient have a  percutaneous coronary intervention (stent / angioplasty)?:  No.   _____________  Discharge Vitals Blood pressure 113/71, pulse 71, temperature 98 F (36.7 C), temperature source Oral, resp. rate 19, height _0  (1.88 m), weight 101.2 kg, SpO2 95 %.  Filed Weights   11/16/19 1429 11/16/19 1616  Weight: 101.2 kg 101.2 kg    Labs & Radiologic Studies    CBC Recent Labs    11/16/19 1511 11/17/19 0500  WBC 15.5* 11.9*  HGB 13.9 11.6*  HCT 43.5 35.6*  MCV 95.8 93.7  PLT 185 616   Basic Metabolic Panel Recent Labs    11/16/19 1511 11/17/19 0500  NA 137 135  K 4.0 3.9  CL 107 106  CO2 21* 22  GLUCOSE 118* 105*  BUN 13 12  CREATININE 1.21 1.08  CALCIUM 9.0 8.3*   Liver Function Tests Recent Labs    11/16/19 1618  AST 27  ALT 34  ALKPHOS 77  BILITOT 0.8  PROT 6.6  ALBUMIN 3.6   Recent Labs    11/16/19 1618  LIPASE 34   High Sensitivity Troponin:   Recent Labs  Lab 11/16/19 1511 11/16/19 1618  TROPONINIHS 1,347* 1,192*    BNP Invalid input(s): POCBNP D-Dimer No results for input(s): DDIMER in the last 72 hours. Hemoglobin A1C No results for input(s): HGBA1C in the last 72 hours. Fasting Lipid Panel No results for input(s): CHOL, HDL, LDLCALC, TRIG, CHOLHDL, LDLDIRECT in the last 72 hours. Thyroid Function Tests No results for input(s): TSH, T4TOTAL, T3FREE, THYROIDAB in the last 72 hours.  Invalid input(s): FREET3 _____________  DG Chest 2 View  Result Date: 11/16/2019 CLINICAL DATA:  Chest pain EXAM: CHEST - 2 VIEW COMPARISON:  None. FINDINGS: The heart size and mediastinal contours are within normal limits. Both lungs are clear. The visualized skeletal structures are unremarkable. IMPRESSION: No active cardiopulmonary disease. Electronically Signed   By: Prudencio Pair M.D.   On: 11/16/2019 15:13   CT Angio Chest PE W and/or Wo Contrast  Result Date: 11/16/2019 CLINICAL DATA:  Shortness of breath and chest pain status post ablation EXAM: CT  ANGIOGRAPHY CHEST WITH CONTRAST TECHNIQUE: Multidetector CT imaging of the chest was performed using the standard protocol during bolus administration of intravenous contrast. Multiplanar CT image reconstructions and MIPs were obtained to evaluate the vascular anatomy. CONTRAST:  2m OMNIPAQUE IOHEXOL 350 MG/ML SOLN COMPARISON:  None. FINDINGS: Cardiovascular: There is a optimal opacification of the pulmonary arteries. There is no central,segmental, or subsegmental filling defects within the pulmonary arteries. There is mild cardiomegaly. Trace pericardial effusion is noted. No evidence right heart strain. There is normal three-vessel brachiocephalic anatomy without proximal stenosis. Scattered aortic atherosclerosis is noted. Mediastinum/Nodes: No hilar, mediastinal, or axillary adenopathy. Thyroid gland, trachea, and esophagus demonstrate no significant findings. Lungs/Pleura: Paraseptal emphysematous changes and bullae are noted. No pleural effusion or pneumothorax. No airspace consolidation. Upper Abdomen: No acute abnormalities present in the visualized portions of the upper abdomen. Musculoskeletal: No chest wall abnormality. No acute or significant osseous findings. Review of the MIP images confirms the above findings. IMPRESSION: No central, segmental, or subsegmental pulmonary embolism. Mild cardiomegaly and trace pericardial effusion. No other acute intrathoracic pathology to explain the  patient's symptoms. Emphysema (ICD10-J43.9).  And Aortic Atherosclerosis (ICD10-I70.0). Electronically Signed   By: Prudencio Pair M.D.   On: 11/16/2019 17:44   EP STUDY  Result Date: 11/15/2019 SURGEON:  Allegra Lai, MD PREPROCEDURE DIAGNOSES: 1. Persistent atrial fibrillation. POSTPROCEDURE DIAGNOSES: 1. Persistent atrial fibrillation. PROCEDURES: 1. Comprehensive electrophysiologic study. 2. Coronary sinus pacing and recording. 3. Three-dimensional mapping of atrial fibrillation (with additional mapping and ablation  within the left atrium due to persistence of afib) 4. Ablation of atrial fibrillation (with additional mapping and ablation within the left atrium due to persistence of afib) 5. Intracardiac echocardiography. 6. Transseptal puncture of an intact septum. 7. Arrhythmia induction with pacing 8. External cardioversion. INTRODUCTION:  Dylan Gordon is a 69 y.o. male with a history of persistent atrial fibrillation who now presents for EP study and radiofrequency ablation.  The patient reports initially being diagnosed with atrial fibrillation after presenting with symptomatic palpitations and fatgiue.  The patient has failed medical therapy.  The patient therefore presents today for catheter ablation of atrial fibrillation. DESCRIPTION OF PROCEDURE:  Informed written consent was obtained, and the patient was brought to the electrophysiology lab in a fasting state.  The patient was adequately sedated with intravenous medications as outlined in the anesthesia report.  The patient's left and right groins were prepped and draped in the usual sterile fashion by the EP lab staff.  Using a percutaneous Seldinger technique, two 7-French and one 11-French hemostasis sheaths were placed into the right common femoral vein.  An esophageal temperature probe was inserted to monitor for heating of the esophagus during the procedure. Direct ultrasound guidance is used for right and left femoral veins with normal vessel patency. Ultrasound images are captured and stored in the patient's chart. Using ultrasound guidance, the Brockenbrough needle and wire were visualized entering the vessel. Catheter Placement:  A 7-French Biosense Webster Decapolar coronary sinus catheter was introduced through the right common femoral vein and advanced into the coronary sinus for recording and pacing from this location.    Initial Measurements: The patient presented to the electrophysiology lab in atrial fibrillation.   The average RR interval measured  679 msec.   Intracardiac Echocardiography: A 10-French Biosense Webster AcuNav intracardiac echocardiography catheter was introduced through the right common femoral vein and advanced into the right atrium. Intracardiac echocardiography was performed of the left atrium, and a three-dimensional anatomical rendering of the left atrium was performed using CARTO sound technology.  The patient was noted to have a moderate sized left atrium.  The interatrial septum was prominent but not aneurysmal. All 4 pulmonary veins were visualized and noted to have separate ostia.  The pulmonary veins were moderate in size.  The left atrial appendage was visualized and did not reveal thrombus.   There was no evidence of pulmonary vein stenosis. Transseptal Puncture: The right common femoral vein sheaths were was exchanged for an 8.5 [place] Agillis transseptal sheath and transseptal access was achieved in a standard fashion using a Brockenbrough needle under biplane fluoroscopy with intracardiac echocardiography confirmation of the transseptal puncture.  Once transseptal access had been achieved, heparin was administered intravenously and intra- arterially in order to maintain an ACT of greater than 350 seconds throughout the procedure.  3D Mapping and Ablation: A 3.5 mm [employer] Thermocool ablation catheter was advanced into the right atrium.  The transseptal sheath was pulled back into the IVC over a guidewire.  The ablation catheter was advanced across the transseptal hole using the wire as a  guide.  The transseptal sheath was then re-advanced over the guidewire into the left atrium.  A duodecapolar Biosense Webster circular mapping catheter was introduced through the transseptal sheath and positioned over the mouth of all 4 pulmonary veins.  Three-dimensional electroanatomical mapping was performed using CARTO technology.  This demonstrated electrical activity within all four pulmonary veins at baseline. The  patient underwent successful sequential electrical isolation and anatomical encircling of all four pulmonary veins using radiofrequency current with a circular mapping catheter as a guide. A WACA approach was used. Due to persistence of atrial fibrillation, additional left atrial mapping and ablation was performed.  A series of radiofrequency lesions were delivered along the roof and floor of the left atrium in order to create a "standard box" lesion along the posterior wall of the left atrium. The ablation catheter was then pulled back into the right atrial and positioned along the cavo-tricuspid isthmus.  3D Mapping along the atrial side of the isthmus was performed.  This demonstrated a standard isthmus.  A series of radiofrequency applications were then delivered along the isthmus at 30-40 Watts with a target index of 450-500.   Complete bidirectional cavotricuspid isthmus block was achieved as confirmed by differential atrial pacing from the low lateral right atrium.  A stimulus to earliest atrial activation across the isthmus measured 170 msec bi-directionally.  The patient was observe without return of conduction through the isthmus. Cardioversion: The patient was then cardioverted to sinus rhythm with a single synchronized 250-J biphasic shock with cardioversion electrodes in the anterior-posterior thoracic configuration.   he remained in sinus rhythm thereafter. Measurements Following Ablation: In sinus rhythm the RR interval was , with PR 115 msec, QRS 90 msec, and QT 410 msec.  Following ablation the AH interval measured 137 msec with an HV interval of 61 msec. Ventricular pacing was performed, which revealed midline decremental VA conduction with a VA Wenckebach cycle length of 600 msec. Rapid atrial pacing was performed, which revealed an AV Wenckebach cycle length of 560 msec.  Electroisolation was then again confirmed in all four pulmonary veins. The procedure was therefore considered  completed.  All catheters were removed, and the sheaths were aspirated and flushed.  The patient was transferred to the recovery area for sheath removal per protocol.  Intracardiac echocardiogram revealed no pericardial effusion. EBL<59ml.  There were no early apparent complications. CONCLUSIONS: 1. Atrial fibrillation upon presentation.  2. Successful electrical isolation and anatomical encircling of all four pulmonary veins with radiofrequency current.  A WACA approach was used 3. Additional left atrial ablation was performed with a standard box lesion created along the posterior wall of the left atrium 4. Atrial fibrillation successfully cardioverted to sinus rhythm. 5. No early apparent complications. Will Daphine Deutscher Camnitz,MD 9:55 AM 11/15/2019   CT CARDIAC MORPH/PULM VEIN W/CM&W/O CA SCORE  Addendum Date: 11/08/2019   ADDENDUM REPORT: 11/08/2019 17:57 CLINICAL DATA:  Atrial fibrillation scheduled for an ablation. 69 year old white male. EXAM: Cardiac CT/CTA TECHNIQUE: The patient was scanned on a Siemens Somatom scanner. FINDINGS: A 120 kV prospective scan was triggered in the descending thoracic aorta at 111 HU's. Gantry rotation speed was 280 msecs and collimation was .9 mm. No beta blockade and no NTG was given. The 3D data set was reconstructed in 5% intervals of the 60-80 % of the R-R cycle. Diastolic phases were analyzed on a dedicated work station using MPR, MIP and VRT modes. The patient received 80 cc of contrast. There is atypical pulmonary vein drainage into  the left atrium (2 on the right and 2 left veins that merge to one common vein on the left) with ostial measurements as follows: RUPV:  18.6 mm X 16.8 mm RLPV:  24.6 mm x 22.2 mm LCPV: 18.6 mm X 15 mm Dilated left atrium. The left atrial appendage is large- broccoli type with two lobes and ostial size 32.5 X 25.5 mm and length 31 mm. There is no thrombus in the left atrial appendage. The esophagus runs in the left atrial midline and is not in  the proximity to any of the pulmonary veins. Aorta:  Normal caliber.  No dissection or calcifications. Aortic Valve:  Trileaflet.  No calcifications. Coronary Arteries: Normal coronary origin. Right dominance. The study was performed without use of NTG and insufficient for plaque evaluation. Coronary calcium score: 207. 56th percentile for age, race, and gender. IMPRESSION: 1. 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 2. Dilated left atrium. The left atrial appendage is large- broccoli type with two lobes and ostial size 32.5 X 25.5 mm and length 31 mm. There is no thrombus in the left atrial appendage. 3. The esophagus runs in the left atrial midline and is not in the proximity to any of the pulmonary veins. 4. Coronary calcium score: 207. 56th percentile for age, race, and gender. Mahesh  Chandrasekhar Electronically Signed   By: Renee Harder MD   On: 11/08/2019 17:57   Result Date: 11/08/2019 EXAM: OVER-READ INTERPRETATION  CT CHEST The following report is an over-read performed by radiologist Dr. Vinnie Langton of [facility], [place] on 11/08/2019. This over-read does not include interpretation of cardiac or coronary anatomy or pathology. The coronary calcium score/coronary CTA interpretation by the cardiologist is attached. COMPARISON:  None. FINDINGS: Aortic atherosclerosis. Within the visualized portions of the thorax there are no suspicious appearing pulmonary nodules or masses, there is no acute consolidative airspace disease, no pleural effusions, no pneumothorax and no lymphadenopathy. Visualized portions of the upper abdomen are unremarkable. There are no aggressive appearing lytic or blastic lesions noted in the visualized portions of the skeleton. IMPRESSION: 1.  Aortic Atherosclerosis (ICD10-I70.0). Electronically Signed: By: Vinnie Langton M.D. On: 11/08/2019 14:11   Disposition   Pt is being discharged home today in good  condition.  Follow-up Plans & Appointments     Follow-up Information    Sherran Needs, NP Follow up on 12/12/2019.   Specialties: Nurse Practitioner, Cardiology Contact information: [address] [place] [address] [phone]                Discharge Medications   Allergies as of 11/17/2019   No Known Allergies     Medication List    TAKE these medications   apixaban 5 MG Tabs tablet Commonly known as: Eliquis Take 1 tablet (5 mg total) by mouth 2 (two) times daily.   atorvastatin 20 MG tablet Commonly known as: LIPITOR Take 20 mg by mouth daily.   finasteride 5 MG tablet Commonly known as: PROSCAR Take 5 mg by mouth daily.   metoprolol succinate 25 MG 24 hr tablet Commonly known as: TOPROL-XL TAKE 1/2 TABLET BY MOUTH EVERY DAY   multivitamin with minerals Tabs tablet Take 1 tablet by mouth once a week.   tamsulosin 0.4 MG Caps capsule Commonly known as: FLOMAX Take 0.4 mg by mouth daily after supper.   Tums Ultra 1000 400 MG chewable tablet Generic drug: calcium elemental as carbonate Chew 1,000 mg by  mouth daily as needed for heartburn.          Outstanding Labs/Studies   None  Duration of Discharge Encounter   Greater than 30 minutes including physician time.  Signed, Fenix Rorke Ninfa Meeker, PA-C 11/17/2019, 12:47 PM

## 2019-11-17 NOTE — Progress Notes (Signed)
Progress Note  Patient Name: Dylan Gordon Date of Encounter: 11/17/2019  [facility] Cardiologist: Thomasene Ripple, DO   Subjective   He feels significantly better today, improved pleuritic chest pain. He noticed a new hematuria. No dysuria.   Inpatient Medications    Scheduled Meds: . apixaban  5 mg Oral BID  . atorvastatin  20 mg Oral Daily  . finasteride  5 mg Oral Daily  . metoprolol succinate  12.5 mg Oral Daily  . tamsulosin  0.4 mg Oral QPC supper   Continuous Infusions:  PRN Meds: acetaminophen   Vital Signs    Vitals:   11/17/19 1035 11/17/19 1108 11/17/19 1130 11/17/19 1200  BP:  122/70 114/67 119/71  Pulse:  75 72 65  Resp: 16 17 20 19   Temp:      TempSrc:      SpO2:  95% 99% 97%  Weight:      Height:       No intake or output data in the 24 hours ending 11/17/19 1207 Last 3 Weights 11/16/2019 11/16/2019 11/15/2019  Weight (lbs) 223 lb 223 lb 220 lb  Weight (kg) 101.152 kg 101.152 kg 99.791 kg      Telemetry    SR -  Personally Reviewed  ECG    Sinus rhythm with first-degree AV block PR interval of 360 ms, diffuse ST elevation suggestive of pericarditis- Personally Reviewed  Physical Exam   GEN: No acute distress.   Neck: No JVD Cardiac: RRR, no murmurs, rubs, or gallops.  Respiratory: Clear to auscultation bilaterally. GI: Soft, nontender, non-distended  MS: No edema; No deformity. Neuro:  Nonfocal  Psych: Normal affect   Labs    High Sensitivity Troponin:   Recent Labs  Lab 11/16/19 1511 11/16/19 1618  TROPONINIHS 1,347* 1,192*      Chemistry Recent Labs  Lab 11/16/19 1511 11/16/19 1618 11/17/19 0500  NA 137  --  135  K 4.0  --  3.9  CL 107  --  106  CO2 21*  --  22  GLUCOSE 118*  --  105*  BUN 13  --  12  CREATININE 1.21  --  1.08  CALCIUM 9.0  --  8.3*  PROT  --  6.6  --   ALBUMIN  --  3.6  --   AST  --  27  --   ALT  --  34  --   ALKPHOS  --  77  --   BILITOT  --  0.8  --   GFRNONAA >60  --  >60  ANIONGAP 9   --  7     Hematology Recent Labs  Lab 11/16/19 1511 11/17/19 0500  WBC 15.5* 11.9*  RBC 4.54 3.80*  HGB 13.9 11.6*  HCT 43.5 35.6*  MCV 95.8 93.7  MCH 30.6 30.5  MCHC 32.0 32.6  RDW 13.6 13.6  PLT 185 153    BNP Recent Labs  Lab 11/16/19 1618  BNP 55.5     DDimer No results for input(s): DDIMER in the last 168 hours.   Radiology    DG Chest 2 View  Result Date: 11/16/2019 CLINICAL DATA:  Chest pain EXAM: CHEST - 2 VIEW COMPARISON:  None. FINDINGS: The heart size and mediastinal contours are within normal limits. Both lungs are clear. The visualized skeletal structures are unremarkable. IMPRESSION: No active cardiopulmonary disease. Electronically Signed   By: 13/06/2019 M.D.   On: 11/16/2019 15:13   CT Angio Chest PE W and/or  Wo Contrast  Result Date: 11/16/2019 CLINICAL DATA:  Shortness of breath and chest pain status post ablation EXAM: CT ANGIOGRAPHY CHEST WITH CONTRAST TECHNIQUE: Multidetector CT imaging of the chest was performed using the standard protocol during bolus administration of intravenous contrast. Multiplanar CT image reconstructions and MIPs were obtained to evaluate the vascular anatomy. CONTRAST:  80mL OMNIPAQUE IOHEXOL 350 MG/ML SOLN COMPARISON:  None. FINDINGS: Cardiovascular: There is a optimal opacification of the pulmonary arteries. There is no central,segmental, or subsegmental filling defects within the pulmonary arteries. There is mild cardiomegaly. Trace pericardial effusion is noted. No evidence right heart strain. There is normal three-vessel brachiocephalic anatomy without proximal stenosis. Scattered aortic atherosclerosis is noted. Mediastinum/Nodes: No hilar, mediastinal, or axillary adenopathy. Thyroid gland, trachea, and esophagus demonstrate no significant findings. Lungs/Pleura: Paraseptal emphysematous changes and bullae are noted. No pleural effusion or pneumothorax. No airspace consolidation. Upper Abdomen: No acute abnormalities  present in the visualized portions of the upper abdomen. Musculoskeletal: No chest wall abnormality. No acute or significant osseous findings. Review of the MIP images confirms the above findings. IMPRESSION: No central, segmental, or subsegmental pulmonary embolism. Mild cardiomegaly and trace pericardial effusion. No other acute intrathoracic pathology to explain the patient's symptoms. Emphysema (ICD10-J43.9).  And Aortic Atherosclerosis (ICD10-I70.0). Electronically Signed   By: Jonna Clark M.D.   On: 11/16/2019 17:44    Cardiac Studies     Patient Profile     69 y.o. male with atrial flutter, hyperlipidemia who went for atrial flutter ablation on 11/15/2019. The procedure was successful.  He has been compliant with his medications and has not missed any doses of his anticoagulation since hospitalization.  At 11 PM yesterday he began having chest pain that was pleuritic in nature.  His pain was worsened on deep inhalation.  He began to have some shortness of breath and chest congestion.   To come to the emergency department for evaluation.  On arrival his laboratory work-up was largely unremarkable.  His vitals were stable.  EKG had nonspecific changes.  However, his high-sensitivity troponin was 1000 and downtrending.  He is still currently having chest discomfort that is worse with deep inhalation.  Positional and does not radiate.  Not having any other notable symptoms.  Assessment & Plan    1. Pleuritic Chest Pain.  Acute onset of pleuritic chest pain that started day post ablation.  We believe this is secondary to his procedure done yesterday [elevated troponin given recent ablation, troponins downtrending.  We will continue him on his home medications and as needed pain medication.   2. He is EKG shows diffuse ST elevation suggestive of pericarditis, in the settings of new hematuria will avoid NSAIDs or she seen unless he has persistent symptoms 3. New hematuria -visually very mild, will  send urinalysis and urine cultures, he has no other symptoms such as dysuria or polyuria 4. Atrial Flutter. Continue apixaban and metoprolol.  5. BPH> continue finasteride and tamsulosin.   We will discharge home today and arrange for follow-up at the EP clinic later this week.  For questions or updates, please contact [facility] Please consult [email or web address] for contact info under        Signed, Tobias Alexander, MD  11/17/2019, 12:07 PM

## 2019-11-17 NOTE — ED Notes (Signed)
Pillows given to pt.

## 2019-11-18 LAB — URINE CULTURE: Culture: <10000 — AB

## 2019-11-23 NOTE — Anesthesia Postprocedure Evaluation (Signed)
Anesthesia Post Note  Patient: [name]  Procedure(s) Performed: ATRIAL FIBRILLATION ABLATION (N/A )     Patient location during evaluation: Cath Lab Anesthesia Type: General Level of consciousness: awake and alert Pain management: pain level controlled Vital Signs Assessment: post-procedure vital signs reviewed and stable Respiratory status: spontaneous breathing, nonlabored ventilation, respiratory function stable and patient connected to nasal cannula oxygen Cardiovascular status: blood pressure returned to baseline and stable Postop Assessment: no apparent nausea or vomiting Anesthetic complications: no   No complications documented.  Last Vitals:  Vitals:   11/15/19 1230 11/15/19 1300  BP: 114/60 115/65  Pulse: 74 74  Resp: 19 18  Temp:    SpO2: 95% 97%    Last Pain:  Vitals:   11/15/19 1300  TempSrc:   PainSc: 0-No pain                 Brayden Brodhead COKER

## 2019-12-13 ENCOUNTER — Other Ambulatory Visit: Payer: Self-pay

## 2019-12-13 ENCOUNTER — Ambulatory Visit (HOSPITAL_COMMUNITY)
Admission: RE | Admit: 2019-12-13 | Discharge: 2019-12-13 | Disposition: A | Discharge status: Home / Self Care | Payer: Medicare HMO | Source: Ambulatory Visit | Attending: Nurse Practitioner | Admitting: Nurse Practitioner

## 2019-12-13 ENCOUNTER — Encounter (HOSPITAL_COMMUNITY): Payer: Self-pay | Admitting: Nurse Practitioner

## 2019-12-13 VITALS — BP 116/60 | HR 80 | Ht 74.0 in | Wt 224.2 lb

## 2019-12-13 DIAGNOSIS — I4891 Unspecified atrial fibrillation: Secondary | ICD-10-CM | POA: Insufficient documentation

## 2019-12-13 DIAGNOSIS — I480 Paroxysmal atrial fibrillation: Secondary | ICD-10-CM

## 2019-12-13 DIAGNOSIS — D6869 Other thrombophilia: Secondary | ICD-10-CM | POA: Diagnosis not present

## 2019-12-13 DIAGNOSIS — Z79899 Other long term (current) drug therapy: Secondary | ICD-10-CM | POA: Diagnosis not present

## 2019-12-13 DIAGNOSIS — Z7901 Long term (current) use of anticoagulants: Secondary | ICD-10-CM | POA: Insufficient documentation

## 2019-12-13 DIAGNOSIS — F17210 Nicotine dependence, cigarettes, uncomplicated: Secondary | ICD-10-CM | POA: Diagnosis not present

## 2019-12-13 NOTE — Progress Notes (Signed)
Primary Care Physician: Paulina Fusi, MD Referring Physician:Dr. Elberta Fortis Cardiologist: Dr. Ernestene Mention is a 69 y.o. male with a h/o afib/flutter that underwent ablation 11/5/2. He reports that he has not felt any afib and that he feels well. He denies any swallowing or groin issues. Is back to normal activities. He continues eliquis with no bleeding issues. CHA2DS2VASc score if one for age(HTN listed in Epic, he states he has not been treated for BP issues) .  Today, he denies symptoms of palpitations, chest pain, shortness of breath, orthopnea, PND, lower extremity edema, dizziness, presyncope, syncope, or neurologic sequela. The patient is tolerating medications without difficulties and is otherwise without complaint today.   Past Medical History:  Diagnosis Date  . Benign prostatic hyperplasia   . Calculus of kidney   . Chronic kidney disease, stage III (moderate) (HCC)   . Enlarged prostate with lower urinary tract symptoms (LUTS)   . Erectile dysfunction   . Hyperlipidemia    Past Surgical History:  Procedure Laterality Date  . ATRIAL FIBRILLATION ABLATION N/A 11/15/2019   Procedure: ATRIAL FIBRILLATION ABLATION;  Surgeon: Regan Lemming, MD;  Location: [facility];  Service: Cardiovascular;  Laterality: N/A;  . PROSTATE BIOPSY    . TONSILLECTOMY  1954    Current Outpatient Medications  Medication Sig Dispense Refill  . apixaban (ELIQUIS) 5 MG TABS tablet Take 1 tablet (5 mg total) by mouth 2 (two) times daily. 180 tablet 1  . atorvastatin (LIPITOR) 20 MG tablet Take 20 mg by mouth daily.    . calcium elemental as carbonate (TUMS ULTRA 1000) 400 MG chewable tablet Chew 1,000 mg by mouth daily as needed for heartburn.    . finasteride (PROSCAR) 5 MG tablet Take 5 mg by mouth daily.    . metoprolol succinate (TOPROL-XL) 25 MG 24 hr tablet TAKE 1/2 TABLET BY MOUTH EVERY DAY (Patient taking differently: Take 12.5 mg by mouth daily. ) 45 tablet 1  .  Multiple Vitamin (MULTIVITAMIN WITH MINERALS) TABS tablet Take 1 tablet by mouth once a week.     . tamsulosin (FLOMAX) 0.4 MG CAPS capsule Take 0.4 mg by mouth daily after supper.     No current facility-administered medications for this encounter.    No Known Allergies  Social History   Socioeconomic History  . Marital status: Divorced    Spouse name: Not on file  . Number of children: Not on file  . Years of education: Not on file  . Highest education level: Not on file  Occupational History  . Not on file  Tobacco Use  . Smoking status: Current Every Day Smoker    Packs/day: 0.50    Years: 53.00    Pack years: 26.50    Types: Cigarettes  . Smokeless tobacco: Never Used  . Tobacco comment: 15 cigarettes per day  Vaping Use  . Vaping Use: Never used  Substance and Sexual Activity  . Alcohol use: Not Currently  . Drug use: Never  . Sexual activity: Not on file  Other Topics Concern  . Not on file  Social History Narrative  . Not on file   Social Determinants of Health   Financial Resource Strain:   . Difficulty of Paying Living Expenses: Not on file  Food Insecurity:   . Worried About Programme researcher, broadcasting/film/video in the Last Year: Not on file  . Ran Out of Food in the Last Year: Not on file  Transportation Needs:   .  Lack of Transportation (Medical): Not on file  . Lack of Transportation (Non-Medical): Not on file  Physical Activity:   . Days of Exercise per Week: Not on file  . Minutes of Exercise per Session: Not on file  Stress:   . Feeling of Stress : Not on file  Social Connections:   . Frequency of Communication with Friends and Family: Not on file  . Frequency of Social Gatherings with Friends and Family: Not on file  . Attends Religious Services: Not on file  . Active Member of Clubs or Organizations: Not on file  . Attends Banker Meetings: Not on file  . Marital Status: Not on file  Intimate Partner Violence:   . Fear of Current or  Ex-Partner: Not on file  . Emotionally Abused: Not on file  . Physically Abused: Not on file  . Sexually Abused: Not on file    Family History  Problem Relation Age of Onset  . Asthma Mother   . Arrhythmia Mother   . Emphysema Father   . Colon cancer Maternal Aunt     ROS- All systems are reviewed and negative except as per the HPI above  Physical Exam: There were no vitals filed for this visit. Wt Readings from Last 3 Encounters:  11/16/19 101.2 kg  11/15/19 99.8 kg  10/21/19 101.2 kg    Labs: Lab Results  Component Value Date   NA 135 11/17/2019   K 3.9 11/17/2019   CL 106 11/17/2019   CO2 22 11/17/2019   GLUCOSE 105 (H) 11/17/2019   BUN 12 11/17/2019   CREATININE 1.08 11/17/2019   CALCIUM 8.3 (L) 11/17/2019   MG 2.2 09/28/2018   No results found for: INR No results found for: CHOL, HDL, LDLCALC, TRIG   GEN- The patient is well appearing, alert and oriented x 3 today.   Head- normocephalic, atraumatic Eyes-  Sclera clear, conjunctiva pink Ears- hearing intact Oropharynx- clear Neck- supple, no JVP Lymph- no cervical lymphadenopathy Lungs- Clear to ausculation bilaterally, normal work of breathing Heart- Regular rate and rhythm, no murmurs, rubs or gallops, PMI not laterally displaced GI- soft, NT, ND, + BS Extremities- no clubbing, cyanosis, or edema MS- no significant deformity or atrophy Skin- no rash or lesion Psych- euthymic mood, full affect Neuro- strength and sensation are intact  EKG- sinus rhythm with first degree AV block, pr int 232 ms, qrs int 86 ms, qtc 410 ms    Assessment and Plan: 1. Afib/flutter S/p ablation  In SR today and does not report any afib  He is back to ususal activites Continue metoprolol succinate 12.5 mg daily  2. CHA2DS2VASc score of 1 (?htn dx) Continue eliquis 5 mg bid  Pt is hopeful he will be able to stop drug at 3 month f/u Reminded  not to interrupt drug use thur the 3 month recovery period   F/u with Dr.  Elberta Fortis 01/17/20  [facility]le. [facility] [address] [place], [place] [address] [phone]

## 2020-01-30 ENCOUNTER — Telehealth: Payer: Self-pay | Admitting: *Deleted

## 2020-01-30 NOTE — Telephone Encounter (Signed)
Spoke with pt about samples

## 2020-01-30 NOTE — Telephone Encounter (Signed)
Computer cut off in middle of note about samples for Eliquis for pt. Pt stated did not know about samples and would not be by to pick them up and to go ahead and put them back. Pt will be coming in for appt in Feb. So will get samples then if needed.

## 2020-02-17 ENCOUNTER — Encounter: Payer: Self-pay | Admitting: Cardiology

## 2020-02-17 ENCOUNTER — Other Ambulatory Visit: Payer: Self-pay

## 2020-02-17 ENCOUNTER — Ambulatory Visit: Payer: Medicare HMO | Admitting: Cardiology

## 2020-02-17 VITALS — BP 126/58 | HR 72 | Ht 74.0 in | Wt 222.0 lb

## 2020-02-17 DIAGNOSIS — I4819 Other persistent atrial fibrillation: Secondary | ICD-10-CM

## 2020-02-17 NOTE — Patient Instructions (Addendum)
Medication Instructions:  Your physician has recommended you make the following change in your medication:  1. STOP Metoprolol (Toprol)  *If you need a refill on your cardiac medications before your next appointment, please call your pharmacy*   Lab Work: None ordered   Testing/Procedures: None ordered   Follow-Up: At [facility], you and your health needs are our priority.  As part of our continuing mission to provide you with exceptional heart care, we have created designated Provider Care Teams.  These Care Teams include your primary Cardiologist (physician) and Advanced Practice Providers (APPs -  Physician Assistants and Nurse Practitioners) who all work together to provide you with the care you need, when you need it.  Your next appointment:   3 month(s)  The format for your next appointment:   In Person  Provider:   Loman Brooklyn, MD    Thank you for choosing [facility]!!   Dory Horn, RN [phone]

## 2020-02-17 NOTE — Progress Notes (Signed)
**Note Dylan-Identified via Obfuscation** Electrophysiology Office Note   Date:  02/17/2020   ID:  Dylan Gordon, DOB June 19, 1950, MRN [id number]  PCP:  Paulina Fusi, MD  Cardiologist:  [place] Primary Electrophysiologist:  Justyn Boyson Jorja Loa, MD    Chief Complaint: atrial flutter   History of Present Illness: Dylan Gordon is a 70 y.o. male who is being seen today for the evaluation of atrial flutter at the request of Paulina Fusi, MD. Presenting today for electrophysiology evaluation.  He has a history of CKD stage III, hyper lipidemia, atrial flutter, atrial fibrillation.  He wore a Zio patch that showed continuous atrial flutter.  Currently on Eliquis.  He is now status post ablation 07/15/2019.  Unfortunately return to the hospital the day later with chest pain, found to have pericarditis and was started on NSAIDs.  Today, denies symptoms of palpitations, chest pain, shortness of breath, orthopnea, PND, lower extremity edema, claudication, dizziness, presyncope, syncope, bleeding, or neurologic sequela. The patient is tolerating medications without difficulties.  He has done well since his ablation.  He has no chest pain or shortness of breath.  His fatigue has improved.  He is noted no further episodes of atrial fibrillation.   Past Medical History:  Diagnosis Date  . Benign prostatic hyperplasia   . Calculus of kidney   . Chronic kidney disease, stage III (moderate) (HCC)   . Enlarged prostate with lower urinary tract symptoms (LUTS)   . Erectile dysfunction   . Hyperlipidemia    Past Surgical History:  Procedure Laterality Date  . ATRIAL FIBRILLATION ABLATION N/A 11/15/2019   Procedure: ATRIAL FIBRILLATION ABLATION;  Surgeon: Regan Lemming, MD;  Location: [facility];  Service: Cardiovascular;  Laterality: N/A;  . PROSTATE BIOPSY    . TONSILLECTOMY  1954     Current Outpatient Medications  Medication Sig Dispense Refill  . apixaban (ELIQUIS) 5 MG TABS tablet Take 1 tablet (5 mg total)  by mouth 2 (two) times daily. 180 tablet 1  . atorvastatin (LIPITOR) 20 MG tablet Take 20 mg by mouth daily.    . calcium elemental as carbonate (TUMS ULTRA 1000) 400 MG chewable tablet Chew 1,000 mg by mouth daily as needed for heartburn.    . finasteride (PROSCAR) 5 MG tablet Take 5 mg by mouth daily.    . Multiple Vitamin (MULTIVITAMIN WITH MINERALS) TABS tablet Take 1 tablet by mouth once a week.     . tamsulosin (FLOMAX) 0.4 MG CAPS capsule Take 0.4 mg by mouth daily after supper.     No current facility-administered medications for this visit.    Allergies:   Patient has no known allergies.   Social History:  The patient  reports that he has been smoking cigarettes. He has a 26.50 pack-year smoking history. He has never used smokeless tobacco. He reports previous alcohol use. He reports that he does not use drugs.   Family History:  The patient's family history includes Arrhythmia in his mother; Asthma in his mother; Colon cancer in his maternal aunt; Emphysema in his father.   ROS:  Please see the history of present illness.   Otherwise, review of systems is positive for none.   All other systems are reviewed and negative.   PHYSICAL EXAM: VS:  BP (!) 126/58   Pulse 72   Ht 6\' 2"  (1.88 m)   Wt 222 lb (100.7 kg)   SpO2 98%   BMI 28.50 kg/m  , BMI Body mass index is 28.5 kg/m. GEN: Well  nourished, well developed, in no acute distress  HEENT: normal  Neck: no JVD, carotid bruits, or masses Cardiac: RRR; no murmurs, rubs, or gallops,no edema  Respiratory:  clear to auscultation bilaterally, normal work of breathing GI: soft, nontender, nondistended, + BS MS: no deformity or atrophy  Skin: warm and dry Neuro:  Strength and sensation are intact Psych: euthymic mood, full affect  EKG:  EKG is ordered today. Personal review of the ekg ordered shows sinus rhythm, rate 72  Recent Labs: 11/16/2019: ALT 34; B Natriuretic Peptide 55.5 11/17/2019: BUN 12; Creatinine, Ser 1.08;  Hemoglobin 11.6; Platelets 153; Potassium 3.9; Sodium 135    Lipid Panel  No results found for: CHOL, TRIG, HDL, CHOLHDL, VLDL, LDLCALC, LDLDIRECT   Wt Readings from Last 3 Encounters:  02/17/20 222 lb (100.7 kg)  12/13/19 224 lb 3.2 oz (101.7 kg)  11/16/19 223 lb (101.2 kg)      Other studies Reviewed: Additional studies/ records that were reviewed today include: TTE 11/01/18  Review of the above records today demonstrates:  1. Left ventricular ejection fraction, by visual estimation, is 60 to  65%. The left ventricle has normal function. Normal left ventricular size.  There is no left ventricular hypertrophy.  2. Left ventricular diastolic Doppler parameters are indeterminate  pattern of LV diastolic filling.  3. Global right ventricle has normal systolic function.The right  ventricular size is normal. No increase in right ventricular wall  thickness.  4. Left atrial size was normal.  5. Right atrial size was normal.  6. The mitral valve is normal in structure. Trace mitral valve  regurgitation. No evidence of mitral stenosis.  7. The tricuspid valve is normal in structure. Tricuspid valve  regurgitation is trivial.  8. The aortic valve is normal in structure. Aortic valve regurgitation  was not visualized by color flow Doppler. Structurally normal aortic  valve, with no evidence of sclerosis or stenosis.  9. The pulmonic valve was normal in structure. Pulmonic valve  regurgitation is not visualized by color flow Doppler.  10. Normal pulmonary artery systolic pressure.   Cardiac monitor 10/22/2018 personally reviewed 100% burden of atrial flutter.  ASSESSMENT AND PLAN:  1.  Persistent atrial fibrillation/typical atrial flutter: Currently on Eliquis with a CHA2DS2-VASc of 2.  Is now status post ablation 11/15/2019.  Unfortunately he returned to the hospital with pericarditis and was started on NSAIDs.  We Dylan Gordon stop his metoprolol today.  2.  Hypertension:  Currently well controlled  3.  Hyperlipidemia: Continue atorvastatin per primary cardiology   Current medicines are reviewed at length with the patient today.   The patient does not have concerns regarding his medicines.  The following changes were made today: Stop metoprolol  Labs/ tests ordered today include:  Orders Placed This Encounter  Procedures  . EKG 12-Lead     Disposition:   FU with Peityn Payton 3 months  Signed, Kandice Schmelter Jorja Loa, MD  02/17/2020 10:12 AM     [facility] [address] [place] [address] [phone] (office) [phone] (fax)

## 2020-03-02 DIAGNOSIS — N400 Enlarged prostate without lower urinary tract symptoms: Secondary | ICD-10-CM | POA: Insufficient documentation

## 2020-03-02 DIAGNOSIS — N200 Calculus of kidney: Secondary | ICD-10-CM | POA: Insufficient documentation

## 2020-03-02 DIAGNOSIS — N183 Chronic kidney disease, stage 3 unspecified: Secondary | ICD-10-CM | POA: Insufficient documentation

## 2020-03-02 DIAGNOSIS — N529 Male erectile dysfunction, unspecified: Secondary | ICD-10-CM | POA: Insufficient documentation

## 2020-03-02 DIAGNOSIS — N401 Enlarged prostate with lower urinary tract symptoms: Secondary | ICD-10-CM | POA: Insufficient documentation

## 2020-03-02 DIAGNOSIS — E785 Hyperlipidemia, unspecified: Secondary | ICD-10-CM | POA: Insufficient documentation

## 2020-03-03 ENCOUNTER — Other Ambulatory Visit: Payer: Self-pay

## 2020-03-03 ENCOUNTER — Ambulatory Visit: Payer: Medicare HMO | Admitting: Cardiology

## 2020-03-03 ENCOUNTER — Encounter: Payer: Self-pay | Admitting: Cardiology

## 2020-03-03 VITALS — BP 124/86 | HR 68 | Ht 74.0 in | Wt 218.6 lb

## 2020-03-03 DIAGNOSIS — I4892 Unspecified atrial flutter: Secondary | ICD-10-CM

## 2020-03-03 DIAGNOSIS — E782 Mixed hyperlipidemia: Secondary | ICD-10-CM

## 2020-03-03 DIAGNOSIS — N1831 Chronic kidney disease, stage 3a: Secondary | ICD-10-CM

## 2020-03-03 NOTE — Progress Notes (Signed)
Cardiology Office Note:    Date:  03/03/2020   ID:  Dylan Gordon, DOB [birth year], MRN [id number]  PCP:  Paulina Fusi, MD  Cardiologist:  Thomasene Ripple, DO  Electrophysiologist:  Will Jorja Loa, MD   Referring MD: Paulina Fusi, MD   I am doing well  History of Present Illness:    Dylan Gordon is a 70 y.o. male with a hx of chronic kidney disease, hyperlipidemia, atrial flutter/atrial fibrillation status post ablation which was done in November 2021 is here today for follow-up visit.    I initially saw patient on September 28, 2018 at which time he presented to be evaluated for irregularheart rhythm. In the office the patient was noted to be atrial flutter 4:1.ZIO monitoring patch were placed on patient to understand how much atrial flutter burden he had as well as his heart rate excursions.  I saw the patient November 01, 2018 at that time I started him on Eliquis 5 mg twice daily. Discussed DC cardioversion with the patient. During the time he was educated about the procedure he was willing to undergo the procedure. In addition he had an echocardiogram which I reviewed with him.  He was seen on November 29, 2018 at that time was status post cardioversion and was back in sinus rhythm. Discussed with patient starting him on antiarrhythmics but he prefers to stay on rate control and did not want any other newmedication.  He was seen on March 01, 2019 at that time he still preferred to be off anticoagulation he was in sinus rhythm.  We therefore continued his beta-blocker as well as his Eliquis and also give the patient some samples.  I saw the patient in August 2021 at that time I referred him to my EP for evaluation for ablation.  He did see EP and was deemed a good candidate for ablation he status post his ablation.   Here for no complaints at this time.  His last visit with EP on February 17, 2020 his metoprolol was stopped.   Past Medical History:   Diagnosis Date  . Benign prostatic hyperplasia   . Calculus of kidney   . Chronic kidney disease, stage III (moderate) (HCC)   . Enlarged prostate with lower urinary tract symptoms (LUTS)   . Erectile dysfunction   . Hyperlipidemia     Past Surgical History:  Procedure Laterality Date  . ATRIAL FIBRILLATION ABLATION N/A 11/15/2019   Procedure: ATRIAL FIBRILLATION ABLATION;  Surgeon: Regan Lemming, MD;  [facility]: [facility];  Service: Cardiovascular;  Laterality: N/A;  . PROSTATE BIOPSY    . TONSILLECTOMY  1954    Current Medications: Current Meds  Medication Sig  . apixaban (ELIQUIS) 5 MG TABS tablet Take 1 tablet (5 mg total) by mouth 2 (two) times daily.  Marland Kitchen atorvastatin (LIPITOR) 20 MG tablet Take 20 mg by mouth daily.  . calcium elemental as carbonate (TUMS ULTRA 1000) 400 MG chewable tablet Chew 1,000 mg by mouth daily as needed for heartburn.  . finasteride (PROSCAR) 5 MG tablet Take 5 mg by mouth daily.  . Multiple Vitamin (MULTIVITAMIN WITH MINERALS) TABS tablet Take 1 tablet by mouth once a week.   . tamsulosin (FLOMAX) 0.4 MG CAPS capsule Take 0.4 mg by mouth daily after supper.     Allergies:   Patient has no known allergies.   Social History   Socioeconomic History  . Marital status: Divorced    Spouse name: Not on file  .  Number of children: Not on file  . Years of education: Not on file  . Highest education level: Not on file  Occupational History  . Not on file  Tobacco Use  . Smoking status: Current Every Day Smoker    Packs/day: 0.50    Years: 53.00    Pack years: 26.50    Types: Cigarettes  . Smokeless tobacco: Never Used  . Tobacco comment: 15 cigarettes per day  Vaping Use  . Vaping Use: Never used  Substance and Sexual Activity  . Alcohol use: Not Currently  . Drug use: Never  . Sexual activity: Not on file  Other Topics Concern  . Not on file  Social History Narrative  . Not on file   Social Determinants of Health    Financial Resource Strain: Not on file  Food Insecurity: Not on file  Transportation Needs: Not on file  Physical Activity: Not on file  Stress: Not on file  Social Connections: Not on file     Family History: The patient's family history includes Arrhythmia in his mother; Asthma in his mother; Colon cancer in his maternal aunt; Emphysema in his father.  ROS:   Review of Systems  Constitution: Negative for decreased appetite, fever and weight gain.  HENT: Negative for congestion, ear discharge, hoarse voice and sore throat.   Eyes: Negative for discharge, redness, vision loss in right eye and visual halos.  Cardiovascular: Negative for chest pain, dyspnea on exertion, leg swelling, orthopnea and palpitations.  Respiratory: Negative for cough, hemoptysis, shortness of breath and snoring.   Endocrine: Negative for heat intolerance and polyphagia.  Hematologic/Lymphatic: Negative for bleeding problem. Does not bruise/bleed easily.  Skin: Negative for flushing, nail changes, rash and suspicious lesions.  Musculoskeletal: Negative for arthritis, joint pain, muscle cramps, myalgias, neck pain and stiffness.  Gastrointestinal: Negative for abdominal pain, bowel incontinence, diarrhea and excessive appetite.  Genitourinary: Negative for decreased libido, genital sores and incomplete emptying.  Neurological: Negative for brief paralysis, focal weakness, headaches and loss of balance.  Psychiatric/Behavioral: Negative for altered mental status, depression and suicidal ideas.  Allergic/Immunologic: Negative for HIV exposure and persistent infections.    EKGs/Labs/Other Studies Reviewed:    The following studies were reviewed today:   EKG:  The ekg ordered today demonstrates   TTE 11/01/18  Review of the above records today demonstrates:  1. Left ventricular ejection fraction, by visual estimation, is 60 to  65%. The left ventricle has normal function. Normal left ventricular size.   There is no left ventricular hypertrophy.  2. Left ventricular diastolic Doppler parameters are indeterminate  pattern of LV diastolic filling.  3. Global right ventricle has normal systolic function.The right  ventricular size is normal. No increase in right ventricular wall  thickness.  4. Left atrial size was normal.  5. Right atrial size was normal.  6. The mitral valve is normal in structure. Trace mitral valve  regurgitation. No evidence of mitral stenosis.  7. The tricuspid valve is normal in structure. Tricuspid valve  regurgitation is trivial.  8. The aortic valve is normal in structure. Aortic valve regurgitation  was not visualized by color flow Doppler. Structurally normal aortic  valve, with no evidence of sclerosis or stenosis.  9. The pulmonic valve was normal in structure. Pulmonic valve  regurgitation is not visualized by color flow Doppler.  10. Normal pulmonary artery systolic pressure.   Cardiac monitor 10/22/2018 which was read by me 100% burden of atrial flutter.  Recent Labs: 11/16/2019:  ALT 34; B Natriuretic Peptide 55.5 11/17/2019: BUN 12; Creatinine, Ser 1.08; Hemoglobin 11.6; Platelets 153; Potassium 3.9; Sodium 135  Recent Lipid Panel No results found for: CHOL, TRIG, HDL, CHOLHDL, VLDL, LDLCALC, LDLDIRECT  Physical Exam:    VS:  BP 124/86   Pulse 68   Ht 6\' 2"  (1.88 m)   Wt 218 lb 9.6 oz (99.2 kg)   SpO2 97%   BMI 28.07 kg/m     Wt Readings from Last 3 Encounters:  03/03/20 218 lb 9.6 oz (99.2 kg)  02/17/20 222 lb (100.7 kg)  12/13/19 224 lb 3.2 oz (101.7 kg)     GEN: Well nourished, well developed in no acute distress HEENT: Normal NECK: No JVD; No carotid bruits LYMPHATICS: No lymphadenopathy CARDIAC: S1S2 noted,RRR, no murmurs, rubs, gallops RESPIRATORY:  Clear to auscultation without rales, wheezing or rhonchi  ABDOMEN: Soft, non-tender, non-distended, +bowel sounds, no guarding. EXTREMITIES: No edema, No cyanosis, no  clubbing MUSCULOSKELETAL:  No deformity  SKIN: Warm and dry NEUROLOGIC:  Alert and oriented x 3, non-focal PSYCHIATRIC:  Normal affect, good insight  ASSESSMENT:    1. Atrial flutter, paroxysmal (HCC)   2. Stage 3a chronic kidney disease (HCC)   3. Mixed hyperlipidemia    PLAN:      He appears to be doing well from a cardiovascular standpoint.  For now he is maintaining his Eliquis.  He will discuss with EP at his next visit if he can get off this medication.  He denies diagnosis of hypertension.  Which then makes him a CHA2DS2-VASc score of 1 due to his age.  Continue with his atorvastatin.  The patient is in agreement with the above plan. The patient left the office in stable condition.  The patient will follow up in   Medication Adjustments/Labs and Tests Ordered: Current medicines are reviewed at length with the patient today.  Concerns regarding medicines are outlined above.  No orders of the defined types were placed in this encounter.  No orders of the defined types were placed in this encounter.   Patient Instructions  Medication Instructions:  Your physician recommends that you continue on your current medications as directed. Please refer to the Current Medication list given to you today.  *If you need a refill on your cardiac medications before your next appointment, please call your pharmacy*   Lab Work: None If you have labs (blood work) drawn today and your tests are completely normal, you will receive your results only by: 14/03/21 MyChart Message (if you have MyChart) OR . A paper copy in the mail If you have any lab test that is abnormal or we need to change your treatment, we will call you to review the results.   Testing/Procedures: None   Follow-Up: At [facility], you and your health needs are our priority.  As part of our continuing mission to provide you with exceptional heart care, we have created designated Provider Care Teams.  These Care Teams  include your primary Cardiologist (physician) and Advanced Practice Providers (APPs -  Physician Assistants and Nurse Practitioners) who all work together to provide you with the care you need, when you need it.  We recommend signing up for the patient portal called "MyChart".  Sign up information is provided on this After Visit Summary.  MyChart is used to connect with patients for Virtual Visits (Telemedicine).  Patients are able to view lab/test results, encounter notes, upcoming appointments, etc.  Non-urgent messages can be sent to your provider  as well.   To learn more about what you can do with MyChart, go to [email or web address].    Your next appointment:   12 month(s)  The format for your next appointment:   In Person  Provider:   Thomasene RippleKardie Jaivyn Gulla, DO   Other Instructions      Adopting a Healthy Lifestyle.  Know what a healthy weight is for you (roughly BMI <25) and aim to maintain this   Aim for 7+ servings of fruits and vegetables daily   65-80+ fluid ounces of water or unsweet tea for healthy kidneys   Limit to max 1 drink of alcohol per day; avoid smoking/tobacco   Limit animal fats in diet for cholesterol and heart health - choose grass fed whenever available   Avoid highly processed foods, and foods high in saturated/trans fats   Aim for low stress - take time to unwind and care for your mental health   Aim for 150 min of moderate intensity exercise weekly for heart health, and weights twice weekly for bone health   Aim for 7-9 hours of sleep daily   When it comes to diets, agreement about the perfect plan isnt easy to find, even among the experts. Experts at the [employer] developed an idea known as the Healthy Eating Plate. Just imagine a plate divided into logical, healthy portions.   The emphasis is on diet quality:   Load up on vegetables and fruits - one-half of your plate: Aim for color and variety, and remember that potatoes  dont count.   Go for whole grains - one-quarter of your plate: Whole wheat, barley, wheat berries, quinoa, oats, brown rice, and foods made with them. If you want pasta, go with whole wheat pasta.   Protein power - one-quarter of your plate: Fish, chicken, beans, and nuts are all healthy, versatile protein sources. Limit red meat.   The diet, however, does go beyond the plate, offering a few other suggestions.   Use healthy plant oils, such as olive, canola, soy, corn, sunflower and peanut. Check the labels, and avoid partially hydrogenated oil, which have unhealthy trans fats.   If youre thirsty, drink water. Coffee and tea are good in moderation, but skip sugary drinks and limit milk and dairy products to one or two daily servings.   The type of carbohydrate in the diet is more important than the amount. Some sources of carbohydrates, such as vegetables, fruits, whole grains, and beans-are healthier than others.   Finally, stay active  Signed, Thomasene RippleKardie Nathania Waldman, DO  03/03/2020 1:19 PM    [facility]

## 2020-03-03 NOTE — Patient Instructions (Signed)

## 2020-04-06 DIAGNOSIS — R972 Elevated prostate specific antigen [PSA]: Secondary | ICD-10-CM | POA: Diagnosis not present

## 2020-04-06 DIAGNOSIS — N401 Enlarged prostate with lower urinary tract symptoms: Secondary | ICD-10-CM | POA: Diagnosis not present

## 2020-04-16 DIAGNOSIS — Z1331 Encounter for screening for depression: Secondary | ICD-10-CM | POA: Diagnosis not present

## 2020-04-16 DIAGNOSIS — Z0000 Encounter for general adult medical examination without abnormal findings: Secondary | ICD-10-CM | POA: Diagnosis not present

## 2020-04-16 DIAGNOSIS — Z9181 History of falling: Secondary | ICD-10-CM | POA: Diagnosis not present

## 2020-04-16 DIAGNOSIS — E785 Hyperlipidemia, unspecified: Secondary | ICD-10-CM | POA: Diagnosis not present

## 2020-04-25 ENCOUNTER — Other Ambulatory Visit: Payer: Self-pay | Admitting: Cardiology

## 2020-05-24 NOTE — Progress Notes (Signed)
Electrophysiology Office Note   Date:  05/25/2020   ID:  Dylan Gordon, DOB 09-03-1950, MRN [id number]  PCP:  Dylan Fusi, MD  Cardiologist:  Dylan Gordon Primary Electrophysiologist:  Dylan Durand Jorja Loa, MD    Chief Complaint: atrial flutter   History of Present Illness: Dylan Gordon is a 70 y.o. male who is being seen today for the evaluation of atrial flutter at the request of Dylan Fusi, MD. Presenting today for electrophysiology evaluation.  He has a history significant for CKD stage III, hyperlipidemia, atrial flutter, and atrial fibrillation.  He is currently on Eliquis.  He is status post A. fib ablation 07/15/2019.  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  He was started on NSAIDs.  Today, denies symptoms of palpitations, chest pain, shortness of breath, orthopnea, PND, lower extremity edema, claudication, dizziness, presyncope, syncope, bleeding, or neurologic sequela. The patient is tolerating medications without difficulties.  Since last being seen he has done well.  He has no chest pain or shortness of breath.  Is able to all of his daily activities and is without restriction.  He has noted no further episodes of atrial fibrillation.  He is overall happy with his control.   Past Medical History:  Diagnosis Date  . Benign prostatic hyperplasia   . Calculus of kidney   . Chronic kidney disease, stage III (moderate) (HCC)   . Enlarged prostate with lower urinary tract symptoms (LUTS)   . Erectile dysfunction   . Hyperlipidemia    Past Surgical History:  Procedure Laterality Date  . ATRIAL FIBRILLATION ABLATION N/A 11/15/2019   Procedure: ATRIAL FIBRILLATION ABLATION;  Surgeon: Dylan Lemming, MD;  Location: [facility];  Service: Cardiovascular;  Laterality: N/A;  . PROSTATE BIOPSY    . TONSILLECTOMY  1954     Current Outpatient Medications  Medication Sig Dispense Refill  . apixaban (ELIQUIS) 5 MG TABS tablet  Take 1 tablet (5 mg total) by mouth 2 (two) times daily. 180 tablet 1  . atorvastatin (LIPITOR) 20 MG tablet Take 20 mg by mouth daily.    . calcium elemental as carbonate (TUMS ULTRA 1000) 400 MG chewable tablet Chew 1,000 mg by mouth daily as needed for heartburn.    . finasteride (PROSCAR) 5 MG tablet Take 5 mg by mouth daily.    . Multiple Vitamin (MULTIVITAMIN WITH MINERALS) TABS tablet Take 1 tablet by mouth once a week.     . tamsulosin (FLOMAX) 0.4 MG CAPS capsule Take 0.4 mg by mouth daily after supper.     No current facility-administered medications for this visit.    Allergies:   Patient has no known allergies.   Social History:  The patient  reports that he has been smoking cigarettes. He has a 26.50 pack-year smoking history. He has never used smokeless tobacco. He reports previous alcohol use. He reports that he does not use drugs.   Family History:  The patient's family history includes Arrhythmia in his mother; Asthma in his mother; Colon cancer in his maternal aunt; Emphysema in his father.   ROS:  Please see the history of present illness.   Otherwise, review of systems is positive for none.   All other systems are reviewed and negative.   PHYSICAL EXAM: VS:  BP 120/70   Pulse 68   Ht 6\' 2"  (1.88 m)   Wt 217 lb 3.2 oz (98.5 kg)   SpO2 96%   BMI 27.89 kg/m  ,  BMI Body mass index is 27.89 kg/m. GEN: Well nourished, well developed, in no acute distress  HEENT: normal  Neck: no JVD, carotid bruits, or masses Cardiac: RRR; no murmurs, rubs, or gallops,no edema  Respiratory:  clear to auscultation bilaterally, normal work of breathing GI: soft, nontender, nondistended, + BS MS: no deformity or atrophy  Skin: warm and dry Neuro:  Strength and sensation are intact Psych: euthymic mood, full affect  EKG:  EKG is ordered today. Personal review of the ekg ordered shows sinus rhythm, rate 68  Recent Labs: 11/16/2019: ALT 34; B Natriuretic Peptide 55.5 11/17/2019: BUN  12; Creatinine, Ser 1.08; Hemoglobin 11.6; Platelets 153; Potassium 3.9; Sodium 135    Lipid Panel  No results found for: CHOL, TRIG, HDL, CHOLHDL, VLDL, LDLCALC, LDLDIRECT   Wt Readings from Last 3 Encounters:  05/25/20 217 lb 3.2 oz (98.5 kg)  03/03/20 218 lb 9.6 oz (99.2 kg)  02/17/20 222 lb (100.7 kg)      Other studies Reviewed: Additional studies/ records that were reviewed today include: TTE 11/01/18  Review of the above records today demonstrates:  1. Left ventricular ejection fraction, by visual estimation, is 60 to  65%. The left ventricle has normal function. Normal left ventricular size.  There is no left ventricular hypertrophy.  2. Left ventricular diastolic Doppler parameters are indeterminate  pattern of LV diastolic filling.  3. Global right ventricle has normal systolic function.The right  ventricular size is normal. No increase in right ventricular wall  thickness.  4. Left atrial size was normal.  5. Right atrial size was normal.  6. The mitral valve is normal in structure. Trace mitral valve  regurgitation. No evidence of mitral stenosis.  7. The tricuspid valve is normal in structure. Tricuspid valve  regurgitation is trivial.  8. The aortic valve is normal in structure. Aortic valve regurgitation  was not visualized by color flow Doppler. Structurally normal aortic  valve, with no evidence of sclerosis or stenosis.  9. The pulmonic valve was normal in structure. Pulmonic valve  regurgitation is not visualized by color flow Doppler.  10. Normal pulmonary artery systolic pressure.   Cardiac monitor 10/22/2018 personally reviewed 100% burden of atrial flutter.  ASSESSMENT AND PLAN:  1.  Persistent atrial fibrillation/typical atrial flutter: Currently on Eliquis.  CHA2DS2-VASc of 1.  Status post ablation 11/15/2019.  He is feeling well.  As his CHA2DS2-VASc is low, we Dylan Gordon stop his Eliquis.  He Dylan Gordon restart if he notices that he goes back into  atrial fibrillation.  2.  Hypertension: Currently not on any medications for blood pressure.  I am questioning this diagnosis.  Well-controlled.  3.  Hyperlipidemia: Continue atorvastatin per primary cardiology   Current medicines are reviewed at length with the patient today.   The patient does not have concerns regarding his medicines.  The following changes were made today: Stop Eliquis  Labs/ tests ordered today include:  Orders Placed This Encounter  Procedures  . EKG 12-Lead     Disposition:   FU with Shemar Plemmons 6 months  Signed, Cissy Galbreath Jorja Loa, MD  05/25/2020 10:16 AM     [facility] [address] [place] [address] [phone] (office) [phone] (fax)

## 2020-05-25 ENCOUNTER — Other Ambulatory Visit: Payer: Self-pay

## 2020-05-25 ENCOUNTER — Ambulatory Visit: Payer: Medicare HMO | Admitting: Cardiology

## 2020-05-25 ENCOUNTER — Encounter: Payer: Self-pay | Admitting: Cardiology

## 2020-05-25 VITALS — BP 120/70 | HR 68 | Ht 74.0 in | Wt 217.2 lb

## 2020-05-25 DIAGNOSIS — I4819 Other persistent atrial fibrillation: Secondary | ICD-10-CM

## 2020-05-25 NOTE — Patient Instructions (Signed)
Medication Instructions:  Your physician has recommended you make the following change in your medication:  1. STOP Eliquis  *If you need a refill on your cardiac medications before your next appointment, please call your pharmacy*   Lab Work: None ordered   Testing/Procedures: None ordered   Follow-Up: At [facility], you and your health needs are our priority.  As part of our continuing mission to provide you with exceptional heart care, we have created designated Provider Care Teams.  These Care Teams include your primary Cardiologist (physician) and Advanced Practice Providers (APPs -  Physician Assistants and Nurse Practitioners) who all work together to provide you with the care you need, when you need it.  Your next appointment:   6 month(s)  The format for your next appointment:   In Person  Provider:   Will Camnitz, MD    Thank you for choosing [facility]!!   Marshell Dilauro, RN [phone]    

## 2020-06-23 DIAGNOSIS — I4891 Unspecified atrial fibrillation: Secondary | ICD-10-CM | POA: Diagnosis not present

## 2020-06-23 DIAGNOSIS — E785 Hyperlipidemia, unspecified: Secondary | ICD-10-CM | POA: Diagnosis not present

## 2020-06-23 DIAGNOSIS — Z6827 Body mass index (BMI) 27.0-27.9, adult: Secondary | ICD-10-CM | POA: Diagnosis not present

## 2020-06-23 DIAGNOSIS — R972 Elevated prostate specific antigen [PSA]: Secondary | ICD-10-CM | POA: Diagnosis not present

## 2020-06-23 DIAGNOSIS — N183 Chronic kidney disease, stage 3 unspecified: Secondary | ICD-10-CM | POA: Diagnosis not present

## 2020-10-07 DIAGNOSIS — R972 Elevated prostate specific antigen [PSA]: Secondary | ICD-10-CM | POA: Diagnosis not present

## 2020-10-07 DIAGNOSIS — N401 Enlarged prostate with lower urinary tract symptoms: Secondary | ICD-10-CM | POA: Diagnosis not present

## 2020-11-23 ENCOUNTER — Encounter: Payer: Self-pay | Admitting: Cardiology

## 2020-11-23 ENCOUNTER — Ambulatory Visit: Payer: Medicare HMO | Admitting: Cardiology

## 2020-11-23 ENCOUNTER — Other Ambulatory Visit: Payer: Self-pay

## 2020-11-23 VITALS — BP 113/64 | HR 74 | Ht 74.0 in | Wt 214.0 lb

## 2020-11-23 DIAGNOSIS — I4819 Other persistent atrial fibrillation: Secondary | ICD-10-CM

## 2020-11-23 NOTE — Addendum Note (Signed)
Addended by: Bernell List on: 11/23/2020 09:15 AM   Modules accepted: Orders

## 2020-11-23 NOTE — Progress Notes (Signed)
Electrophysiology Office Note   Date:  11/23/2020   ID:  Dylan Gordon, DOB 06-Feb-1950, MRN [id number]  PCP:  Paulina Fusi, MD  Cardiologist:  Tobb Primary Electrophysiologist:  Rishab Stoudt Jorja Loa, MD    Chief Complaint: atrial flutter   History of Present Illness: Dylan Gordon is a 70 y.o. male who is being seen today for the evaluation of atrial flutter at the request of Paulina Fusi, MD. Presenting today for electrophysiology evaluation.  He has a history significant for CKD stage III, hyperlipidemia, atrial flutter, and atrial fibrillation.  He is currently on Eliquis.  He is status post atrial fibrillation ablation 07/15/2019.  He had chest pain following ablation and was noted to have pericarditis.  He was started on NSAIDs with resolution of his symptoms.  Today, denies symptoms of palpitations, chest pain, shortness of breath, orthopnea, PND, lower extremity edema, claudication, dizziness, presyncope, syncope, bleeding, or neurologic sequela. The patient is tolerating medications without difficulties.  Since being seen he has done well.  He has had no chest pain or shortness of breath.  Is able to do all of his daily activities.  He has noted no further episodes of atrial fibrillation.  He is overall happy with his control.   Past Medical History:  Diagnosis Date   Benign prostatic hyperplasia    Calculus of kidney    Chronic kidney disease, stage III (moderate) (HCC)    Enlarged prostate with lower urinary tract symptoms (LUTS)    Erectile dysfunction    Hyperlipidemia    Past Surgical History:  Procedure Laterality Date   ATRIAL FIBRILLATION ABLATION N/A 11/15/2019   Procedure: ATRIAL FIBRILLATION ABLATION;  Surgeon: Regan Lemming, MD;  Location: [facility];  Service: Cardiovascular;  Laterality: N/A;   PROSTATE BIOPSY     TONSILLECTOMY  1954     Current Outpatient Medications  Medication Sig Dispense Refill   atorvastatin (LIPITOR) 20  MG tablet Take 20 mg by mouth daily.     calcium elemental as carbonate (TUMS ULTRA 1000) 400 MG chewable tablet Chew 1,000 mg by mouth daily as needed for heartburn.     finasteride (PROSCAR) 5 MG tablet Take 5 mg by mouth daily.     Multiple Vitamin (MULTIVITAMIN WITH MINERALS) TABS tablet Take 1 tablet by mouth once a week.      tamsulosin (FLOMAX) 0.4 MG CAPS capsule Take 0.4 mg by mouth daily after supper.     No current facility-administered medications for this visit.    Allergies:   Patient has no known allergies.   Social History:  The patient  reports that he has been smoking cigarettes. He has a 26.50 pack-year smoking history. He has never used smokeless tobacco. He reports that he does not currently use alcohol. He reports that he does not use drugs.   Family History:  The patient's family history includes Arrhythmia in his mother; Asthma in his mother; Colon cancer in his maternal aunt; Emphysema in his father.   ROS:  Please see the history of present illness.   Otherwise, review of systems is positive for none.   All other systems are reviewed and negative.   PHYSICAL EXAM: VS:  BP 113/64   Pulse 74   Ht 6\' 2"  (1.88 m)   Wt 214 lb (97.1 kg)   SpO2 98%   BMI 27.48 kg/m  , BMI Body mass index is 27.48 kg/m. GEN: Well nourished, well developed, in no acute distress  HEENT: normal  Neck: no JVD, carotid bruits, or masses Cardiac: RRR; no murmurs, rubs, or gallops,no edema  Respiratory:  clear to auscultation bilaterally, normal work of breathing GI: soft, nontender, nondistended, + BS MS: no deformity or atrophy  Skin: warm and dry Neuro:  Strength and sensation are intact Psych: euthymic mood, full affect  EKG:  EKG is ordered today. Personal review of the ekg ordered shows sinus rhythm, first-degree AV block, rate 74  Recent Labs: No results found for requested labs within last 8760 hours.    Lipid Panel  No results found for: CHOL, TRIG, HDL, CHOLHDL, VLDL,  LDLCALC, LDLDIRECT   Wt Readings from Last 3 Encounters:  11/23/20 214 lb (97.1 kg)  05/25/20 217 lb 3.2 oz (98.5 kg)  03/03/20 218 lb 9.6 oz (99.2 kg)      Other studies Reviewed: Additional studies/ records that were reviewed today include: TTE 11/01/18  Review of the above records today demonstrates:   1. Left ventricular ejection fraction, by visual estimation, is 60 to  65%. The left ventricle has normal function. Normal left ventricular size.  There is no left ventricular hypertrophy.   2. Left ventricular diastolic Doppler parameters are indeterminate  pattern of LV diastolic filling.   3. Global right ventricle has normal systolic function.The right  ventricular size is normal. No increase in right ventricular wall  thickness.   4. Left atrial size was normal.   5. Right atrial size was normal.   6. The mitral valve is normal in structure. Trace mitral valve  regurgitation. No evidence of mitral stenosis.   7. The tricuspid valve is normal in structure. Tricuspid valve  regurgitation is trivial.   8. The aortic valve is normal in structure. Aortic valve regurgitation  was not visualized by color flow Doppler. Structurally normal aortic  valve, with no evidence of sclerosis or stenosis.   9. The pulmonic valve was normal in structure. Pulmonic valve  regurgitation is not visualized by color flow Doppler.  10. Normal pulmonary artery systolic pressure.   Cardiac monitor 10/22/2018 personally reviewed 100% burden of atrial flutter.  ASSESSMENT AND PLAN:  1.  Persistent atrial fibrillation/typical atrial flutter: CHA2DS2-VASc of 1.  Status post ablation 11/15/2019.  He is feeling well.  Eliquis was stopped at his last visit.  He has had no further episodes of atrial fibrillation.  He is currently feeling well.  We Theadora Noyes continue with current management.  2.  Hypertension: Currently not on any medication for blood pressure.  It is certainly possible that he does not actually  have a diagnosis of hyper tension.  Continue with current management..  3.  Hyperlipidemia: Continue atorvastatin 20 mg per primary cardiology or primary physician.   Current medicines are reviewed at length with the patient today.   The patient does not have concerns regarding his medicines.  The following changes were made today: None none  Labs/ tests ordered today include:  No orders of the defined types were placed in this encounter.    Disposition:   FU with Kathrynn Backstrom 6 months  Signed, Jilda Kress Meredith Leeds, MD  11/23/2020 9:09 AM     [facility] [address] [place] [address] [phone] (office) [phone] (fax)

## 2020-12-28 DIAGNOSIS — N400 Enlarged prostate without lower urinary tract symptoms: Secondary | ICD-10-CM | POA: Diagnosis not present

## 2020-12-28 DIAGNOSIS — E785 Hyperlipidemia, unspecified: Secondary | ICD-10-CM | POA: Diagnosis not present

## 2020-12-28 DIAGNOSIS — I4891 Unspecified atrial fibrillation: Secondary | ICD-10-CM | POA: Diagnosis not present

## 2020-12-28 DIAGNOSIS — N183 Chronic kidney disease, stage 3 unspecified: Secondary | ICD-10-CM | POA: Diagnosis not present

## 2020-12-28 DIAGNOSIS — Z6826 Body mass index (BMI) 26.0-26.9, adult: Secondary | ICD-10-CM | POA: Diagnosis not present

## 2021-04-02 ENCOUNTER — Ambulatory Visit: Payer: Medicare HMO | Admitting: Cardiology

## 2021-04-29 DIAGNOSIS — N401 Enlarged prostate with lower urinary tract symptoms: Secondary | ICD-10-CM | POA: Diagnosis not present

## 2021-04-29 DIAGNOSIS — R972 Elevated prostate specific antigen [PSA]: Secondary | ICD-10-CM | POA: Diagnosis not present

## 2021-06-03 ENCOUNTER — Ambulatory Visit: Payer: Medicare HMO | Admitting: Cardiology

## 2021-06-29 DIAGNOSIS — Z6826 Body mass index (BMI) 26.0-26.9, adult: Secondary | ICD-10-CM | POA: Diagnosis not present

## 2021-06-29 DIAGNOSIS — Z125 Encounter for screening for malignant neoplasm of prostate: Secondary | ICD-10-CM | POA: Diagnosis not present

## 2021-06-29 DIAGNOSIS — Z9181 History of falling: Secondary | ICD-10-CM | POA: Diagnosis not present

## 2021-06-29 DIAGNOSIS — Z1331 Encounter for screening for depression: Secondary | ICD-10-CM | POA: Diagnosis not present

## 2021-06-29 DIAGNOSIS — N183 Chronic kidney disease, stage 3 unspecified: Secondary | ICD-10-CM | POA: Diagnosis not present

## 2021-06-29 DIAGNOSIS — I4891 Unspecified atrial fibrillation: Secondary | ICD-10-CM | POA: Diagnosis not present

## 2021-06-29 DIAGNOSIS — Z139 Encounter for screening, unspecified: Secondary | ICD-10-CM | POA: Diagnosis not present

## 2021-06-29 DIAGNOSIS — E785 Hyperlipidemia, unspecified: Secondary | ICD-10-CM | POA: Diagnosis not present

## 2021-07-12 ENCOUNTER — Encounter: Payer: Self-pay | Admitting: Cardiology

## 2021-07-12 ENCOUNTER — Ambulatory Visit: Payer: Medicare HMO | Admitting: Cardiology

## 2021-07-12 VITALS — BP 128/70 | HR 68 | Ht 74.0 in | Wt 204.8 lb

## 2021-07-12 DIAGNOSIS — I4819 Other persistent atrial fibrillation: Secondary | ICD-10-CM | POA: Diagnosis not present

## 2021-07-12 NOTE — Progress Notes (Signed)
Electrophysiology Office Note   Date:  07/12/2021   ID:  Uno Esau, DOB [birth year], MRN [id number]  PCP:  Paulina Fusi, MD  Cardiologist:  Tobb Primary Electrophysiologist:  Avriel Kandel Jorja Loa, MD    Chief Complaint: atrial flutter   History of Present Illness: Pieter Fooks is a 71 y.o. male who is being seen today for the evaluation of atrial flutter at the request of Paulina Fusi, MD. Presenting today for electrophysiology evaluation.  He has a history significant for CKD stage III, hyperlipidemia, atrial flutter, atrial fibrillation.  He is currently on Eliquis.  He is status post ablation 07/15/2019.  He developed pericarditis post procedure and restarted on NSAIDs with resolution of symptoms.  Today, denies symptoms of palpitations, chest pain, shortness of breath, orthopnea, PND, lower extremity edema, claudication, dizziness, presyncope, syncope, bleeding, or neurologic sequela. The patient is tolerating medications without difficulties.  Since being seen he has done well.  He has noted no further episodes of atrial fibrillation.  He is remained in normal rhythm.  He is overall quite happy with his control.  Past Medical History:  Diagnosis Date   Benign prostatic hyperplasia    Calculus of kidney    Chronic kidney disease, stage III (moderate) (HCC)    Enlarged prostate with lower urinary tract symptoms (LUTS)    Erectile dysfunction    Hyperlipidemia    Past Surgical History:  Procedure Laterality Date   ATRIAL FIBRILLATION ABLATION N/A 11/15/2019   Procedure: ATRIAL FIBRILLATION ABLATION;  Surgeon: Regan Lemming, MD;  Location: [facility];  Service: Cardiovascular;  Laterality: N/A;   PROSTATE BIOPSY     TONSILLECTOMY  1954     Current Outpatient Medications  Medication Sig Dispense Refill   atorvastatin (LIPITOR) 20 MG tablet Take 20 mg by mouth daily.     calcium elemental as carbonate (TUMS ULTRA 1000) 400 MG chewable tablet Chew  1,000 mg by mouth daily as needed for heartburn.     finasteride (PROSCAR) 5 MG tablet Take 5 mg by mouth daily.     Multiple Vitamin (MULTIVITAMIN WITH MINERALS) TABS tablet Take 1 tablet by mouth once a week.      tamsulosin (FLOMAX) 0.4 MG CAPS capsule Take 0.4 mg by mouth daily after supper.     No current facility-administered medications for this visit.    Allergies:   Patient has no known allergies.   Social History:  The patient  reports that he has been smoking cigarettes. He has a 26.50 pack-year smoking history. He has never used smokeless tobacco. He reports that he does not currently use alcohol. He reports that he does not use drugs.   Family History:  The patient's family history includes Arrhythmia in his mother; Asthma in his mother; Colon cancer in his maternal aunt; Emphysema in his father.   ROS:  Please see the history of present illness.   Otherwise, review of systems is positive for none.   All other systems are reviewed and negative.   PHYSICAL EXAM: VS:  BP 128/70   Pulse 68   Ht 6\' 2"  (1.88 m)   Wt 204 lb 12.8 oz (92.9 kg)   SpO2 96%   BMI 26.29 kg/m  , BMI Body mass index is 26.29 kg/m. GEN: Well nourished, well developed, in no acute distress  HEENT: normal  Neck: no JVD, carotid bruits, or masses Cardiac: RRR; no murmurs, rubs, or gallops,no edema  Respiratory:  clear to auscultation bilaterally, normal work  of breathing GI: soft, nontender, nondistended, + BS MS: no deformity or atrophy  Skin: warm and dry Neuro:  Strength and sensation are intact Psych: euthymic mood, full affect  EKG:  EKG is ordered today. Personal review of the ekg ordered shows sinus rhythm, first-degree AV block, PACs  Recent Labs: No results found for requested labs within last 365 days.    Lipid Panel  No results found for: "CHOL", "TRIG", "HDL", "CHOLHDL", "VLDL", "LDLCALC", "LDLDIRECT"   Wt Readings from Last 3 Encounters:  07/12/21 204 lb 12.8 oz (92.9 kg)   11/23/20 214 lb (97.1 kg)  05/25/20 217 lb 3.2 oz (98.5 kg)      Other studies Reviewed: Additional studies/ records that were reviewed today include: TTE 11/01/18  Review of the above records today demonstrates:   1. Left ventricular ejection fraction, by visual estimation, is 60 to  65%. The left ventricle has normal function. Normal left ventricular size.  There is no left ventricular hypertrophy.   2. Left ventricular diastolic Doppler parameters are indeterminate  pattern of LV diastolic filling.   3. Global right ventricle has normal systolic function.The right  ventricular size is normal. No increase in right ventricular wall  thickness.   4. Left atrial size was normal.   5. Right atrial size was normal.   6. The mitral valve is normal in structure. Trace mitral valve  regurgitation. No evidence of mitral stenosis.   7. The tricuspid valve is normal in structure. Tricuspid valve  regurgitation is trivial.   8. The aortic valve is normal in structure. Aortic valve regurgitation  was not visualized by color flow Doppler. Structurally normal aortic  valve, with no evidence of sclerosis or stenosis.   9. The pulmonic valve was normal in structure. Pulmonic valve  regurgitation is not visualized by color flow Doppler.  10. Normal pulmonary artery systolic pressure.   Cardiac monitor 10/22/2018 personally reviewed 100% burden of atrial flutter.  ASSESSMENT AND PLAN:  1.  Persistent atrial fibrillation/typical atrial flutter: CHA2DS2-VASc of 1.  Post ablation 11/15/2019.  He has fortunately remained in normal rhythm without any further episodes of atrial fibrillation.  We Norine Reddington continue with current management.  2.  Hypertension: Currently well controlled  3.  Hyperlipidemia: Continue atorvastatin 20 mg daily per primary physician and primary cardiology   Current medicines are reviewed at length with the patient today.   The patient does not have concerns regarding his  medicines.  The following changes were made today: None  Labs/ tests ordered today include:  Orders Placed This Encounter  Procedures   EKG 12-Lead      Disposition:   FU with Austyn Seier 12 months  Signed, Garrette Caine Jorja Loa, MD  07/12/2021 3:38 PM     [facility] [address] [place] [address] [phone] (office) [phone] (fax)

## 2021-10-18 DIAGNOSIS — N401 Enlarged prostate with lower urinary tract symptoms: Secondary | ICD-10-CM | POA: Diagnosis not present

## 2021-10-18 DIAGNOSIS — R972 Elevated prostate specific antigen [PSA]: Secondary | ICD-10-CM | POA: Diagnosis not present

## 2021-12-29 DIAGNOSIS — Z0000 Encounter for general adult medical examination without abnormal findings: Secondary | ICD-10-CM | POA: Diagnosis not present

## 2021-12-29 DIAGNOSIS — E785 Hyperlipidemia, unspecified: Secondary | ICD-10-CM | POA: Diagnosis not present

## 2021-12-29 DIAGNOSIS — R7301 Impaired fasting glucose: Secondary | ICD-10-CM | POA: Diagnosis not present

## 2021-12-29 DIAGNOSIS — N183 Chronic kidney disease, stage 3 unspecified: Secondary | ICD-10-CM | POA: Diagnosis not present

## 2021-12-29 DIAGNOSIS — Z6826 Body mass index (BMI) 26.0-26.9, adult: Secondary | ICD-10-CM | POA: Diagnosis not present

## 2021-12-29 DIAGNOSIS — I4891 Unspecified atrial fibrillation: Secondary | ICD-10-CM | POA: Diagnosis not present

## 2022-02-17 ENCOUNTER — Encounter (HOSPITAL_COMMUNITY): Payer: Self-pay | Admitting: *Deleted

## 2022-04-20 DIAGNOSIS — R972 Elevated prostate specific antigen [PSA]: Secondary | ICD-10-CM | POA: Diagnosis not present

## 2022-04-20 DIAGNOSIS — N401 Enlarged prostate with lower urinary tract symptoms: Secondary | ICD-10-CM | POA: Diagnosis not present

## 2022-07-04 DIAGNOSIS — L84 Corns and callosities: Secondary | ICD-10-CM | POA: Diagnosis not present

## 2022-07-04 DIAGNOSIS — I4891 Unspecified atrial fibrillation: Secondary | ICD-10-CM | POA: Diagnosis not present

## 2022-07-04 DIAGNOSIS — G629 Polyneuropathy, unspecified: Secondary | ICD-10-CM | POA: Diagnosis not present

## 2022-07-04 DIAGNOSIS — E785 Hyperlipidemia, unspecified: Secondary | ICD-10-CM | POA: Diagnosis not present

## 2022-07-04 DIAGNOSIS — Z125 Encounter for screening for malignant neoplasm of prostate: Secondary | ICD-10-CM | POA: Diagnosis not present

## 2022-07-04 DIAGNOSIS — N183 Chronic kidney disease, stage 3 unspecified: Secondary | ICD-10-CM | POA: Diagnosis not present

## 2022-07-21 IMAGING — CT CT HEART MORPH/PULM VEIN W/ CM & W/O CA SCORE
2 of 8 series · 10 of 20 positions shown, 12 images · IV contrast (Omni 300)
Comparison: None.
COMPARISON: None.

Addendum:
EXAM:
OVER-READ INTERPRETATION  CT CHEST

The following report is an over-read performed by radiologist Dr.
Rickie Jim [REDACTED] on 11/08/2019. This
over-read does not include interpretation of cardiac or coronary
anatomy or pathology. The coronary calcium score/coronary CTA
interpretation by the cardiologist is attached.
CLINICAL DATA: Atrial fibrillation scheduled for an ablation. 69
year old white male.
Cardiac CT/CTA
TECHNIQUE: The patient was scanned on a Siemens Somatom scanner.

[Series 9: 0-95% · axial · 0.41mm/px · z∈[+1229,+1322]mm · 5 of 2800 slices shown, 7 images]
[im 467/2800  vessel]
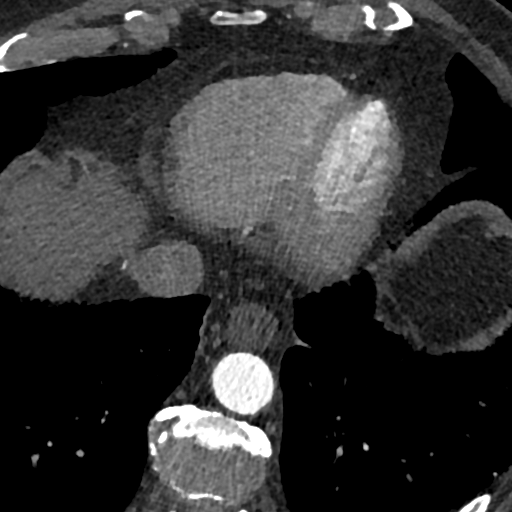
[im 467/2800  lung]
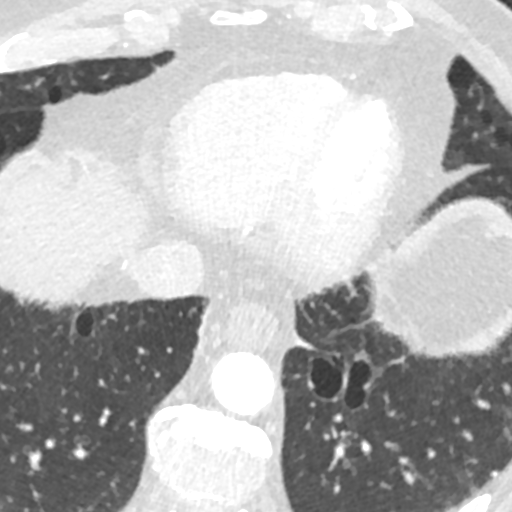
[im 934/2800  vessel]
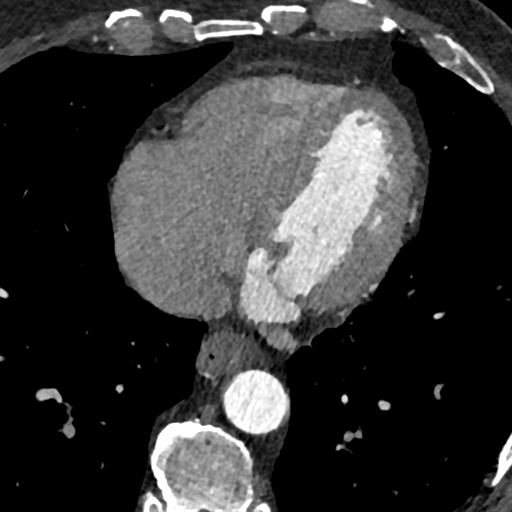
[im 1400/2800  vessel]
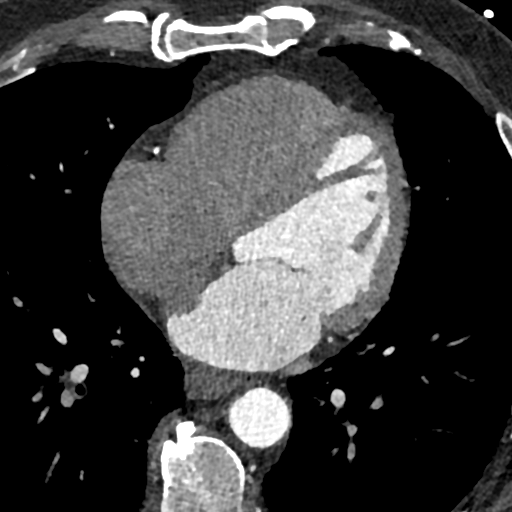
[im 1867/2800  vessel]
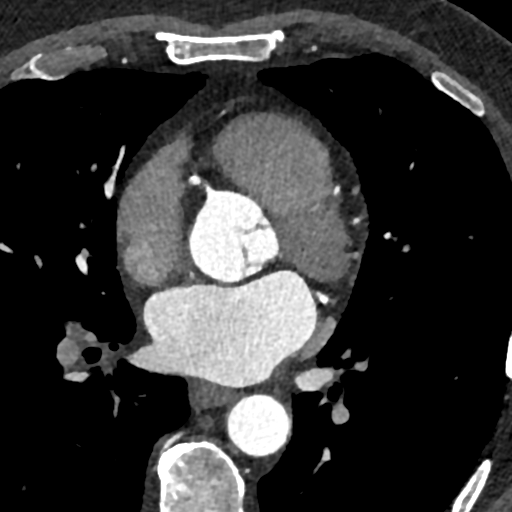
[im 2333/2800  vessel]
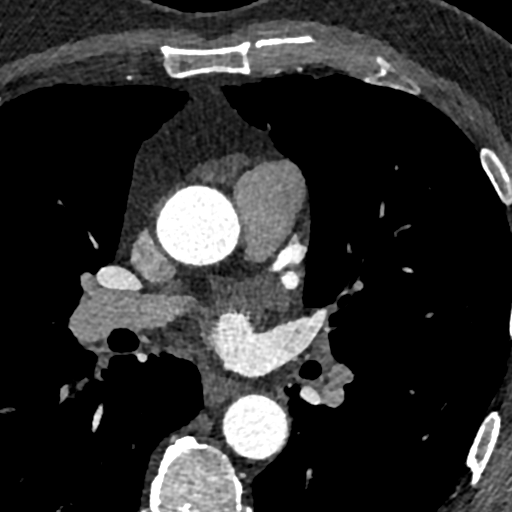
[im 2333/2800  lung]
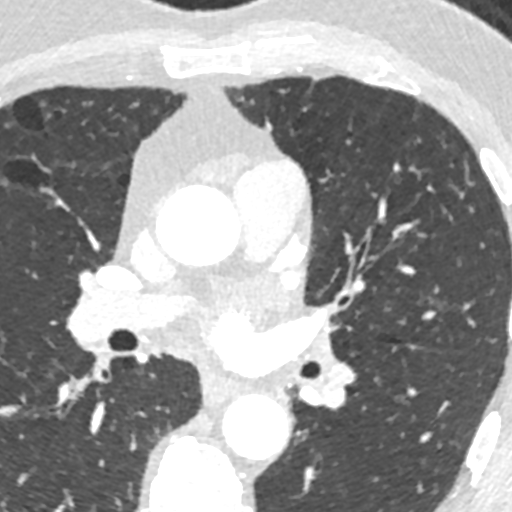

[Series 12: 5-95% · axial · 0.97mm/px · z∈[+1229,+1322]mm · 5 of 2800 slices shown]
[im 467/2800  vessel]
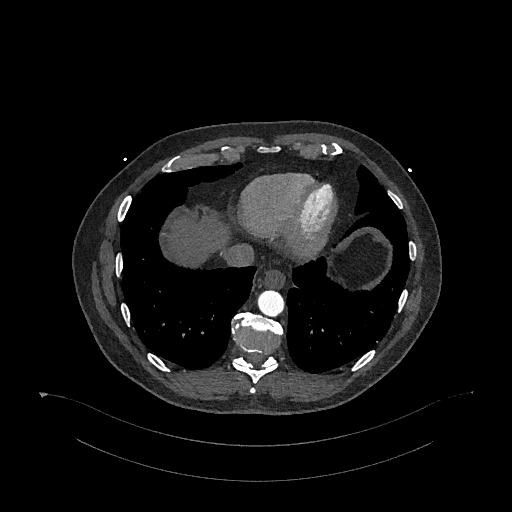
[im 934/2800  vessel]
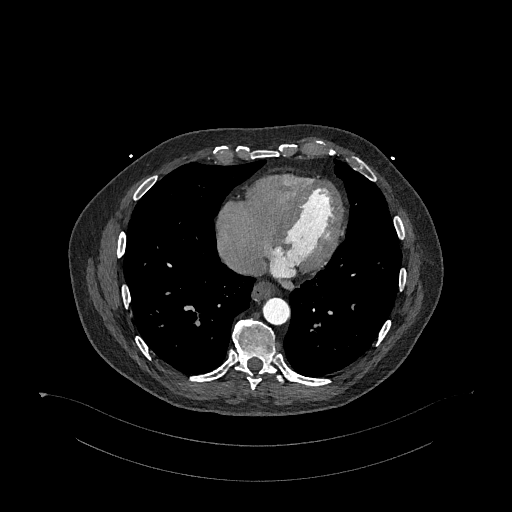
[im 1400/2800  vessel]
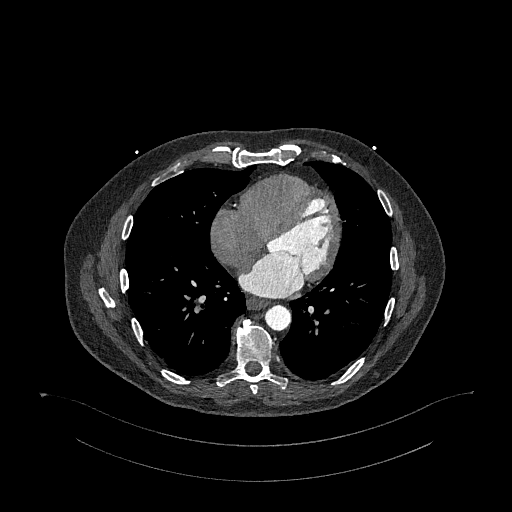
[im 1867/2800  vessel]
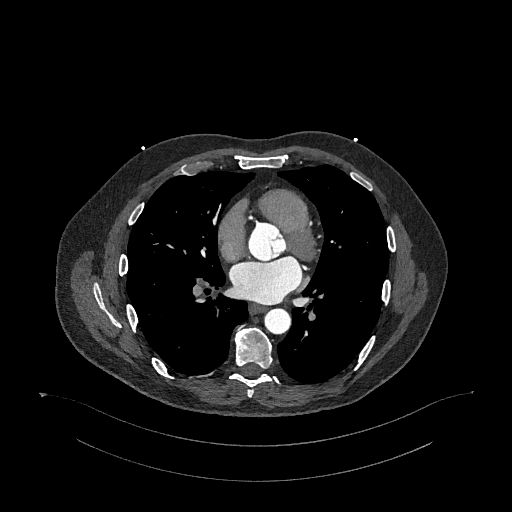
[im 2333/2800  vessel]
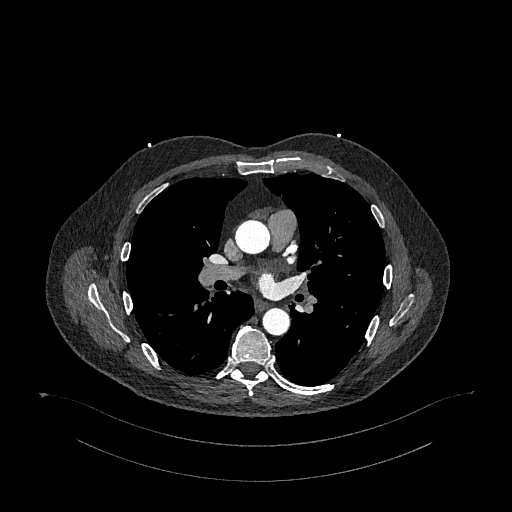

[10 of 20 positions shown; findings below may reference images not displayed]

FINDINGS: Aortic atherosclerosis. Within the visualized portions of the thorax
there are no suspicious appearing pulmonary nodules or masses, there
is no acute consolidative airspace disease, no pleural effusions, no
pneumothorax and no lymphadenopathy. Visualized portions of the
upper abdomen are unremarkable. There are no aggressive appearing
lytic or blastic lesions noted in the visualized portions of the
skeleton.
IMPRESSION: 1.  Aortic Atherosclerosis (1EYPR-F5A.A).
FINDINGS: A 120 kV prospective scan was triggered in the descending thoracic
aorta at 111 HU's. Gantry rotation speed was 280 msecs and
collimation was .9 mm. No beta blockade and no NTG was given. The 3D
data set was reconstructed in 5% intervals of the 60-80 % of the R-R
cycle. Diastolic phases were analyzed on a dedicated work station
using MPR, MIP and VRT modes. The patient received 80 cc of
contrast.

There is atypical pulmonary vein drainage into the left atrium (2 on
the right and 2 left veins that merge to one common vein on the
left) with ostial measurements as follows:

RUPV:  18.6 mm X 16.8 mm

RLPV:  24.6 mm x 22.2 mm

LCPV: 18.6 mm X 15 mm

Dilated left atrium. The left atrial appendage is large- broccoli
type with two lobes and ostial size 32.5 X 25.5 mm and length 31 mm.
There is no thrombus in the left atrial appendage.

The esophagus runs in the left atrial midline and is not in the
proximity to any of the pulmonary veins.

Aorta:  Normal caliber.  No dissection or calcifications.

Aortic Valve:  Trileaflet.  No calcifications.

Coronary Arteries: Normal coronary origin. Right dominance. The
study was performed without use of NTG and insufficient for plaque
evaluation.

Coronary calcium score: 207. 56th percentile for age, race, and
gender.
IMPRESSION: 1. There is atypical pulmonary vein drainage into the left atrium as
noted above.

2. Dilated left atrium. The left atrial appendage is large- broccoli
type with two lobes and ostial size 32.5 X 25.5 mm and length 31 mm.
There is no thrombus in the left atrial appendage.

3. The esophagus runs in the left atrial midline and is not in the
proximity to any of the pulmonary veins.

4. Coronary calcium score: 207. 56th percentile for age, race, and
gender.

Michelangelo Lunt

*** End of Addendum ***
EXAM:
OVER-READ INTERPRETATION  CT CHEST

The following report is an over-read performed by radiologist Dr.
Rickie Jim [REDACTED] on 11/08/2019. This
over-read does not include interpretation of cardiac or coronary
anatomy or pathology. The coronary calcium score/coronary CTA
interpretation by the cardiologist is attached.
FINDINGS: Aortic atherosclerosis. Within the visualized portions of the thorax
there are no suspicious appearing pulmonary nodules or masses, there
is no acute consolidative airspace disease, no pleural effusions, no
pneumothorax and no lymphadenopathy. Visualized portions of the
upper abdomen are unremarkable. There are no aggressive appearing
lytic or blastic lesions noted in the visualized portions of the
skeleton.
IMPRESSION: 1.  Aortic Atherosclerosis (1EYPR-F5A.A).

## 2022-07-29 IMAGING — CT CT ANGIO CHEST
3 of 7 series · 19 of 36 positions shown · IV contrast (omnipaque)
Comparison: None.

CLINICAL DATA: Shortness of breath and chest pain status post
ablation

EXAM:
CT ANGIOGRAPHY CHEST WITH CONTRAST
TECHNIQUE: Multidetector CT imaging of the chest was performed using the
standard protocol during bolus administration of intravenous
contrast. Multiplanar CT image reconstructions and MIPs were
obtained to evaluate the vascular anatomy.
CONTRAST:  75mL OMNIPAQUE IOHEXOL 350 MG/ML SOLN

[Series 6: pe thins · axial · 0.87mm/px · z∈[+52,+375]mm · 15 of 527 slices shown]
[im 33/527  lung]
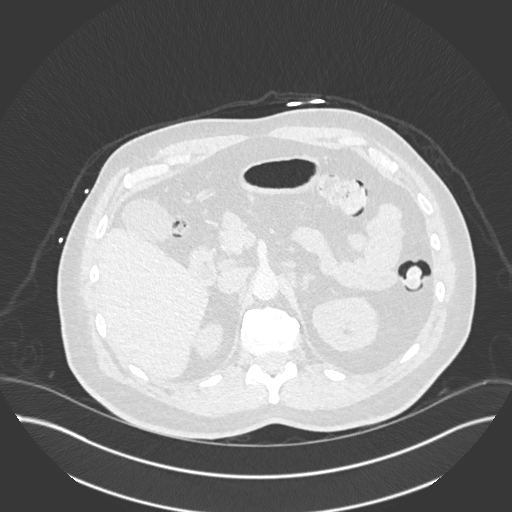
[im 66/527  mediastinal]
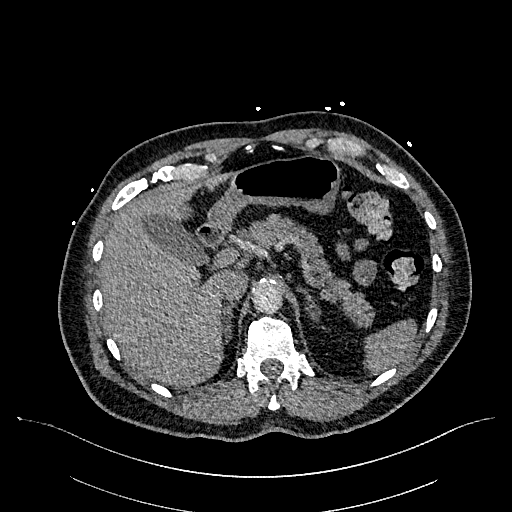
[im 99/527  lung]
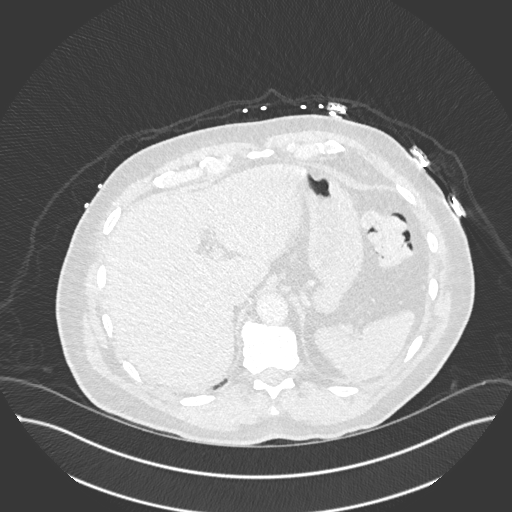
[im 132/527  mediastinal]
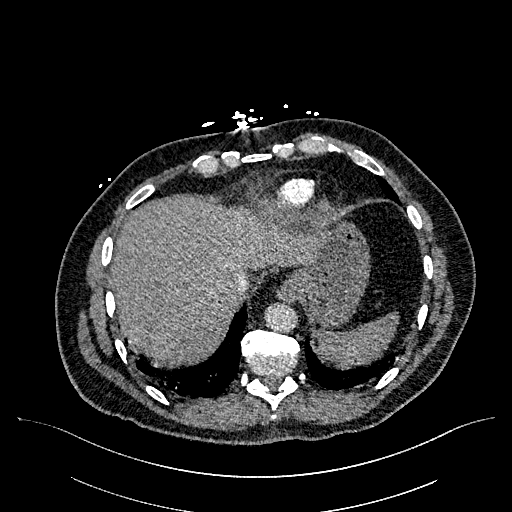
[im 165/527  lung]
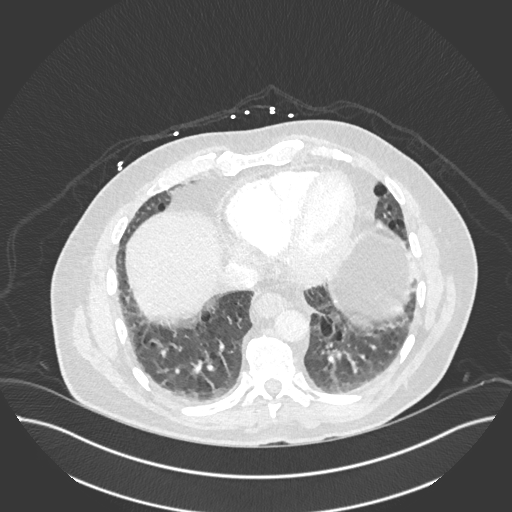
[im 198/527  mediastinal]
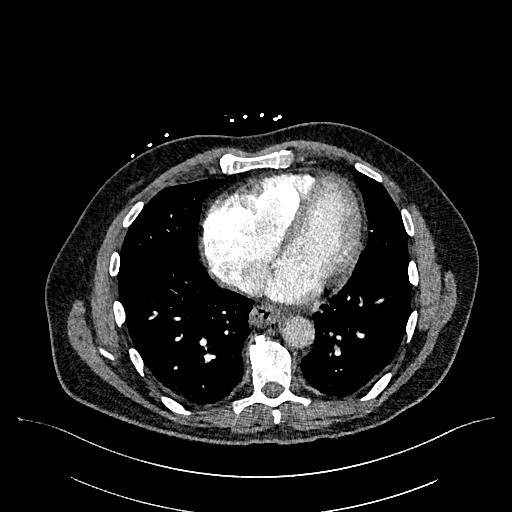
[im 231/527  lung]
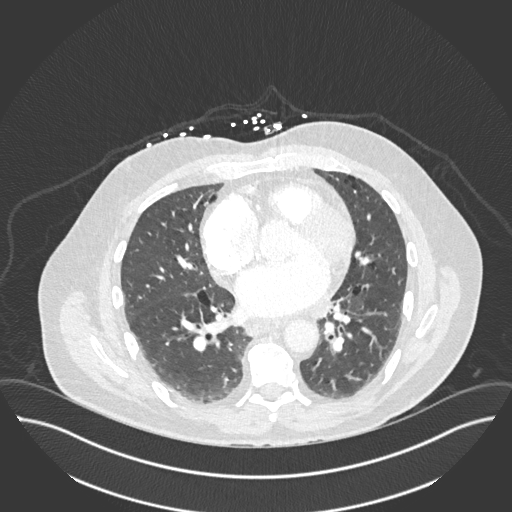
[im 264/527  mediastinal]
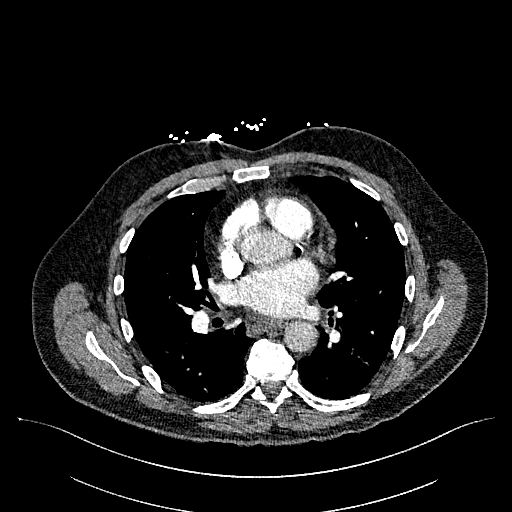
[im 296/527  lung]
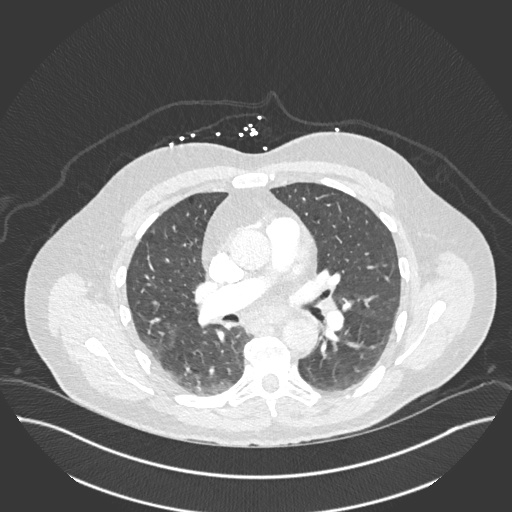
[im 329/527  mediastinal]
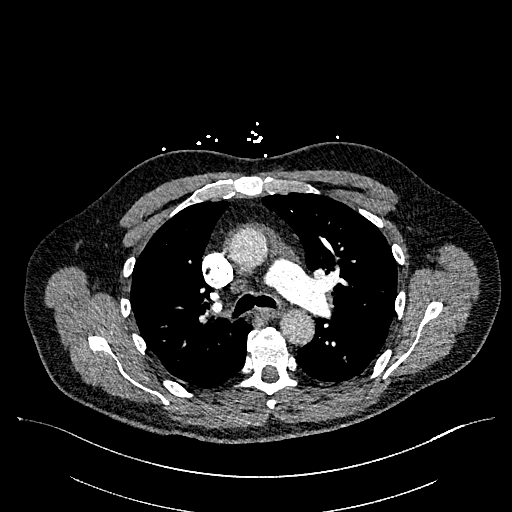
[im 362/527  lung]
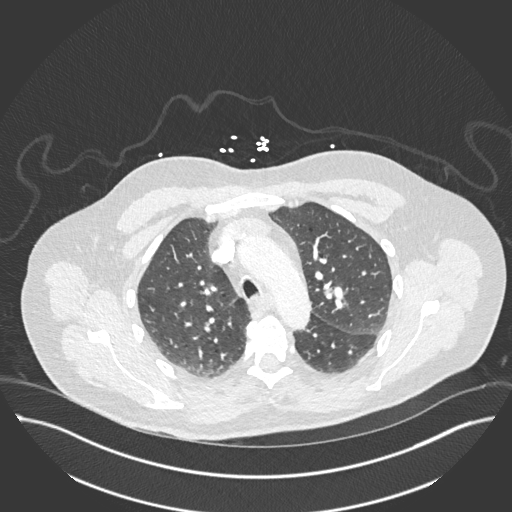
[im 395/527  mediastinal]
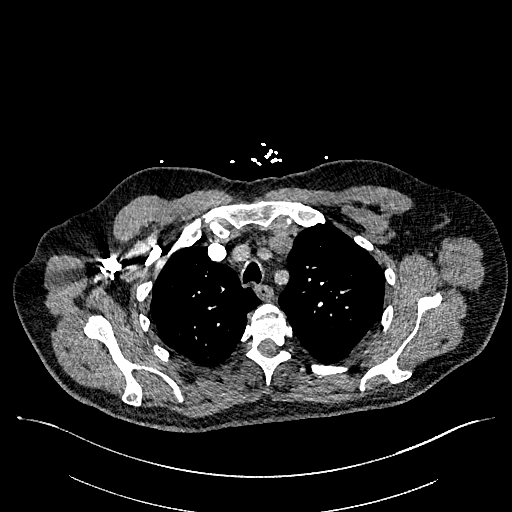
[im 428/527  lung]
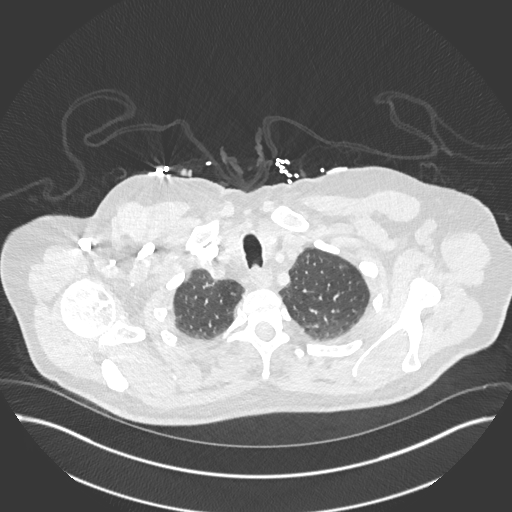
[im 461/527  mediastinal]
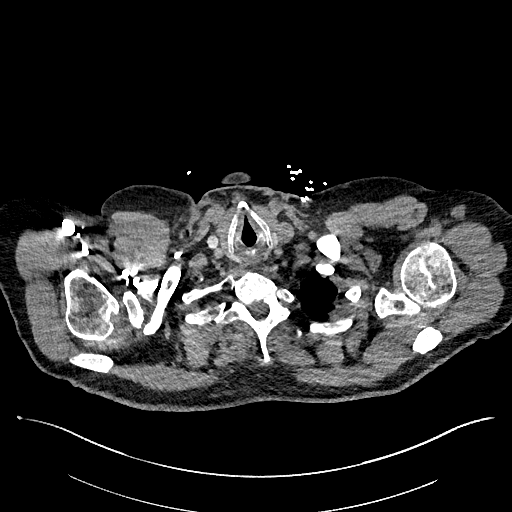
[im 494/527  lung]
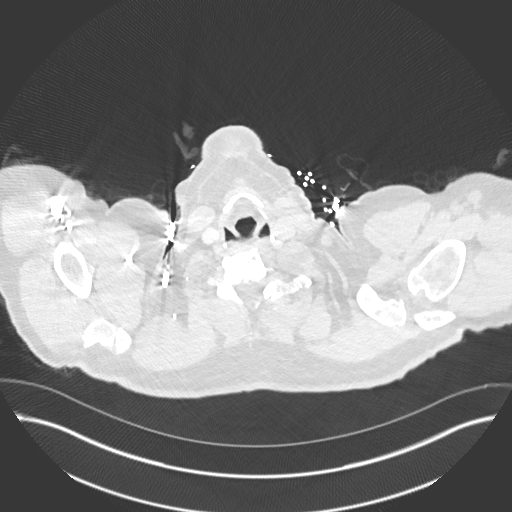

[Series 7: pe lung · axial · 0.74mm/px · z∈[+140,+304]mm · 3 of 165 slices shown]
[im 42/165  mediastinal]
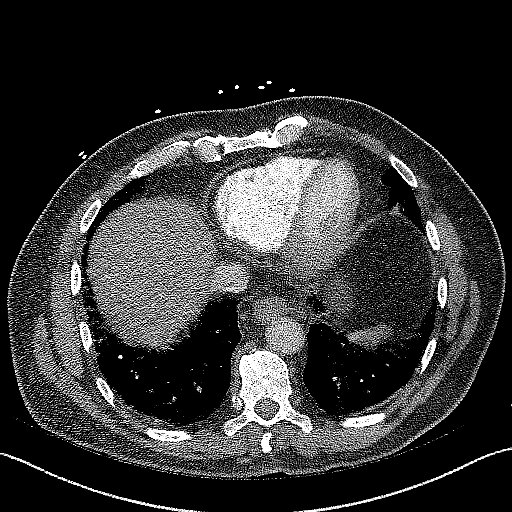
[im 83/165  mediastinal]
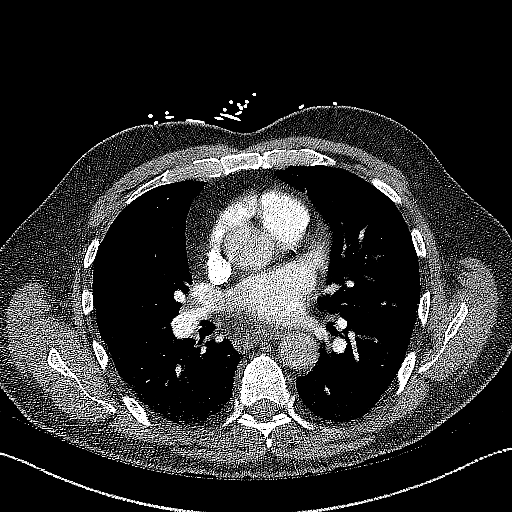
[im 124/165  mediastinal]
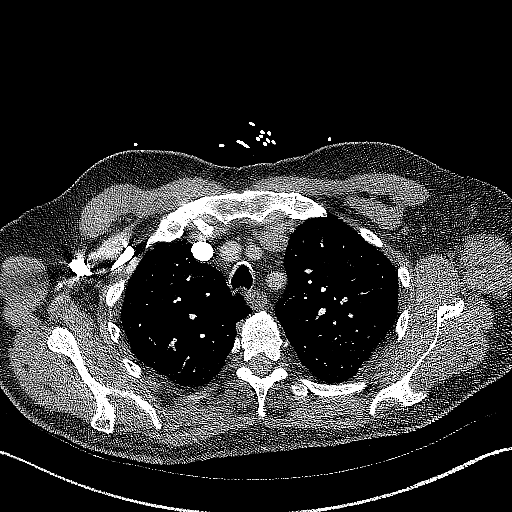

[Series 8: pe 2mm cor · coronal · 0.72mm/px · 1 of 165 slices shown]
[im 83/165  mediastinal]
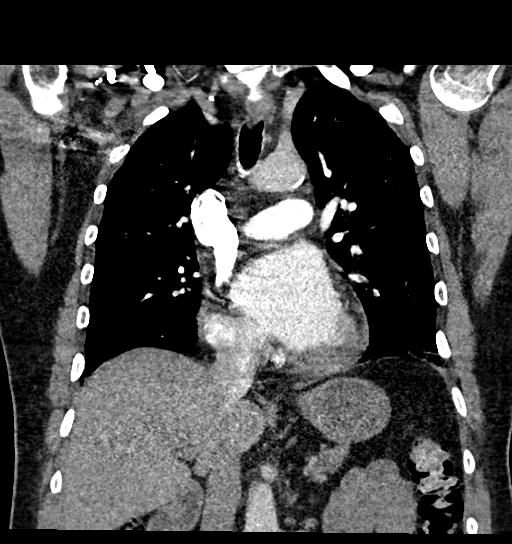

[19 of 36 positions shown; findings below may reference images not displayed]

FINDINGS: Cardiovascular: There is a optimal opacification of the pulmonary
arteries. There is no central,segmental, or subsegmental filling
defects within the pulmonary arteries. There is mild cardiomegaly.
Trace pericardial effusion is noted. No evidence right heart strain.
There is normal three-vessel brachiocephalic anatomy without
proximal stenosis. Scattered aortic atherosclerosis is noted.

Mediastinum/Nodes: No hilar, mediastinal, or axillary adenopathy.
Thyroid gland, trachea, and esophagus demonstrate no significant
findings.

Lungs/Pleura: Paraseptal emphysematous changes and bullae are noted.
No pleural effusion or pneumothorax. No airspace consolidation.

Upper Abdomen: No acute abnormalities present in the visualized
portions of the upper abdomen.

Musculoskeletal: No chest wall abnormality. No acute or significant
osseous findings.

Review of the MIP images confirms the above findings.
IMPRESSION: No central, segmental, or subsegmental pulmonary embolism.

Mild cardiomegaly and trace pericardial effusion.

No other acute intrathoracic pathology to explain the patient's
symptoms.

Emphysema (58YNY-CY7.H).  And Aortic Atherosclerosis (58YNY-AJU.U).

## 2022-10-08 NOTE — Progress Notes (Unsigned)
Electrophysiology Office Note:   Date:  10/10/2022  ID:  Dylan Gordon, DOB 05-Oct-1950, MRN [id number]  Primary Cardiologist: Thomasene Ripple, DO Electrophysiologist: Regan Lemming, MD      History of Present Illness:   Dylan Gordon is a 72 y.o. male with h/o CKD stage III, hyperlipidemia, atrial flutter, atrial relation seen today for routine electrophysiology followup.   Since last being seen in our clinic the patient reports doing overall well.  He continues to do all of his daily activities without restriction.  He has no chest pain or shortness of breath.  He has noted no further episodes of atrial fibrillation over the last year.  he denies chest pain, palpitations, dyspnea, PND, orthopnea, nausea, vomiting, dizziness, syncope, edema, weight gain, or early satiety.   Review of systems complete and found to be negative unless listed in HPI.   EP Information / Studies Reviewed:    EKG is ordered today. Personal review as below.  EKG Interpretation Date/Time:  Monday October 10 2022 09:44:23 EDT Ventricular Rate:  71 PR Interval:  272 QRS Duration:  84 QT Interval:  368 QTC Calculation: 399 R Axis:   72  Text Interpretation: Sinus rhythm with sinus arrhythmia with 1st degree A-V block Otherwise normal ECG When compared with ECG of 13-Dec-2019 09:46, No significant change was found Confirmed by Ashani Pumphrey ([id number]) on 10/10/2022 9:51:33 AM     Risk Assessment/Calculations:    CHA2DS2-VASc Score = 2   This indicates a 2.2% annual risk of stroke. The patient's score is based upon: CHF History: 0 HTN History: 1 Diabetes History: 0 Stroke History: 0 Vascular Disease History: 0 Age Score: 1 Gender Score: 0             Physical Exam:   VS:  BP 124/64   Pulse 71   Ht 6\' 2"  (1.88 m)   Wt 203 lb 6.4 oz (92.3 kg)   SpO2 96%   BMI 26.12 kg/m    Wt Readings from Last 3 Encounters:  10/10/22 203 lb 6.4 oz (92.3 kg)  07/12/21 204 lb 12.8 oz (92.9 kg)  11/23/20  214 lb (97.1 kg)     GEN: Well nourished, well developed in no acute distress NECK: No JVD; No carotid bruits CARDIAC: Regular rate and rhythm, no murmurs, rubs, gallops RESPIRATORY:  Clear to auscultation without rales, wheezing or rhonchi  ABDOMEN: Soft, non-tender, non-distended EXTREMITIES:  No edema; No deformity   ASSESSMENT AND PLAN:    1.  Persistent atrial fibrillation/flutter: Post ablation 11/15/2019.  Has remained in sinus rhythm.  Shemaiah Round continue with current management.  2.  Hypertension: Currently well-controlled  3.  Hyperlipidemia: Continue atorvastatin  Follow up with EP APP in 12 months  Signed, Rj Pedrosa Jorja Loa, MD

## 2022-10-10 ENCOUNTER — Ambulatory Visit: Payer: Medicare HMO | Attending: Cardiology | Admitting: Cardiology

## 2022-10-10 ENCOUNTER — Encounter: Payer: Self-pay | Admitting: Cardiology

## 2022-10-10 VITALS — BP 124/64 | HR 71 | Ht 74.0 in | Wt 203.4 lb

## 2022-10-10 DIAGNOSIS — E785 Hyperlipidemia, unspecified: Secondary | ICD-10-CM | POA: Diagnosis not present

## 2022-10-10 DIAGNOSIS — I10 Essential (primary) hypertension: Secondary | ICD-10-CM | POA: Diagnosis not present

## 2022-10-10 DIAGNOSIS — I4819 Other persistent atrial fibrillation: Secondary | ICD-10-CM | POA: Diagnosis not present

## 2022-11-07 DIAGNOSIS — N401 Enlarged prostate with lower urinary tract symptoms: Secondary | ICD-10-CM | POA: Diagnosis not present

## 2022-11-07 DIAGNOSIS — R972 Elevated prostate specific antigen [PSA]: Secondary | ICD-10-CM | POA: Diagnosis not present

## 2023-03-07 DIAGNOSIS — Z1331 Encounter for screening for depression: Secondary | ICD-10-CM | POA: Diagnosis not present

## 2023-03-07 DIAGNOSIS — Z0000 Encounter for general adult medical examination without abnormal findings: Secondary | ICD-10-CM | POA: Diagnosis not present

## 2023-03-07 DIAGNOSIS — Z9181 History of falling: Secondary | ICD-10-CM | POA: Diagnosis not present

## 2023-05-15 DIAGNOSIS — R972 Elevated prostate specific antigen [PSA]: Secondary | ICD-10-CM | POA: Diagnosis not present

## 2023-05-15 DIAGNOSIS — N401 Enlarged prostate with lower urinary tract symptoms: Secondary | ICD-10-CM | POA: Diagnosis not present

## 2023-07-10 DIAGNOSIS — N183 Chronic kidney disease, stage 3 unspecified: Secondary | ICD-10-CM | POA: Diagnosis not present

## 2023-07-10 DIAGNOSIS — I4891 Unspecified atrial fibrillation: Secondary | ICD-10-CM | POA: Diagnosis not present

## 2023-07-10 DIAGNOSIS — N401 Enlarged prostate with lower urinary tract symptoms: Secondary | ICD-10-CM | POA: Diagnosis not present

## 2023-07-10 DIAGNOSIS — N138 Other obstructive and reflux uropathy: Secondary | ICD-10-CM | POA: Diagnosis not present

## 2023-07-10 DIAGNOSIS — Z125 Encounter for screening for malignant neoplasm of prostate: Secondary | ICD-10-CM | POA: Diagnosis not present

## 2023-07-10 DIAGNOSIS — E785 Hyperlipidemia, unspecified: Secondary | ICD-10-CM | POA: Diagnosis not present

## 2023-10-12 NOTE — Progress Notes (Signed)
  Electrophysiology Office Note:   Date:  10/16/2023  ID:  Lemoine Goyne, DOB 1950-12-17, MRN [id number]  Primary Cardiologist: Dub Huntsman, DO Primary Heart Failure: None Electrophysiologist: Gaelan Glennon Gladis Norton, MD      History of Present Illness:   Dylan Gordon is a 73 y.o. male with h/o CKD stage III, hyperlipidemia, atrial fibrillation/flutter seen today for routine electrophysiology followup.   Since last being seen in our clinic the patient reports doing well.  He can do all of his daily activities.  He has no acute complaints at this time.  He has noted no further episodes of atrial fibrillation.  he denies chest pain, palpitations, dyspnea, PND, orthopnea, nausea, vomiting, dizziness, syncope, edema, weight gain, or early satiety.   Review of systems complete and found to be negative unless listed in HPI.   EP Information / Studies Reviewed:    EKG is ordered today. Personal review as below.  EKG Interpretation Date/Time:  Monday October 16 2023 11:57:07 EDT Ventricular Rate:  71 PR Interval:    QRS Duration:  82 QT Interval:  364 QTC Calculation: 395 R Axis:   28  Text Interpretation: Sinus rhythm Premature atrial complexes When compared with ECG of 10-Oct-2022 09:44, Atrial fibrillation has replaced Sinus rhythm Confirmed by Yeila Morro ([id number]) on 10/16/2023 12:08:25 PM Also confirmed by Damaso Laday ([id number])  on 10/16/2023 12:08:44 PM     Risk Assessment/Calculations:    CHA2DS2-VASc Score = 2   This indicates a 2.2% annual risk of stroke. The patient's score is based upon: CHF History: 0 HTN History: 1 Diabetes History: 0 Stroke History: 0 Vascular Disease History: 0 Age Score: 1 Gender Score: 0            Physical Exam:   VS:  BP 120/66   Pulse 71   Ht 6' 2 (1.88 m)   Wt 197 lb 9.6 oz (89.6 kg)   SpO2 95%   BMI 25.37 kg/m    Wt Readings from Last 3 Encounters:  10/16/23 197 lb 9.6 oz (89.6 kg)  10/10/22 203 lb 6.4 oz (92.3 kg)  07/12/21 204  lb 12.8 oz (92.9 kg)     GEN: Well nourished, well developed in no acute distress NECK: No JVD; No carotid bruits CARDIAC: Regular rate and rhythm, no murmurs, rubs, gallops RESPIRATORY:  Clear to auscultation without rales, wheezing or rhonchi  ABDOMEN: Soft, non-tender, non-distended EXTREMITIES:  No edema; No deformity   ASSESSMENT AND PLAN:    1.  Persistent atrial fibrillation/flutter: Post ablation 11/15/2019.  He has had no further episodes of atrial fibrillation since his ablation.  He is feeling well and is happy with his control.  No changes.  2.  Hypertension: Well-controlled  3.  Hyperlipidemia: Continue atorvastatin   4.  Secondary hypercoagulable state: No longer anticoagulated.  Follow up with Dr. Norton as needed   Signed, Onur Mori Gladis Norton, MD

## 2023-10-16 ENCOUNTER — Encounter: Payer: Self-pay | Admitting: Cardiology

## 2023-10-16 ENCOUNTER — Ambulatory Visit: Attending: Cardiology | Admitting: Cardiology

## 2023-10-16 VITALS — BP 120/66 | HR 71 | Ht 74.0 in | Wt 197.6 lb

## 2023-10-16 DIAGNOSIS — I4819 Other persistent atrial fibrillation: Secondary | ICD-10-CM | POA: Diagnosis not present

## 2023-10-16 DIAGNOSIS — I10 Essential (primary) hypertension: Secondary | ICD-10-CM

## 2023-10-16 DIAGNOSIS — D6869 Other thrombophilia: Secondary | ICD-10-CM

## 2023-11-15 DIAGNOSIS — R972 Elevated prostate specific antigen [PSA]: Secondary | ICD-10-CM | POA: Diagnosis not present

## 2023-11-15 DIAGNOSIS — N401 Enlarged prostate with lower urinary tract symptoms: Secondary | ICD-10-CM | POA: Diagnosis not present
